# Patient Record
Sex: Female | Born: 1935 | Race: Black or African American | Hispanic: No | Marital: Single | Smoking: Never smoker
Health system: Southern US, Community
[De-identification: ages and names within clinical notes are randomized; demographics above are authoritative.]

## PROBLEM LIST (undated history)

## (undated) DIAGNOSIS — I872 Venous insufficiency (chronic) (peripheral): Secondary | ICD-10-CM

## (undated) DIAGNOSIS — I480 Paroxysmal atrial fibrillation: Secondary | ICD-10-CM

## (undated) DIAGNOSIS — I495 Sick sinus syndrome: Secondary | ICD-10-CM

## (undated) HISTORY — DX: Paroxysmal atrial fibrillation: I48.0

## (undated) HISTORY — DX: Venous insufficiency (chronic) (peripheral): I87.2

## (undated) HISTORY — DX: Sick sinus syndrome: I49.5

---

## 1998-09-08 ENCOUNTER — Other Ambulatory Visit: Admission: RE | Admit: 1998-09-08 | Discharge: 1998-09-08 | Payer: Self-pay | Admitting: Obstetrics and Gynecology

## 1998-09-18 ENCOUNTER — Ambulatory Visit (HOSPITAL_COMMUNITY): Admission: RE | Admit: 1998-09-18 | Discharge: 1998-09-18 | Payer: Self-pay | Admitting: Cardiology

## 1998-09-18 ENCOUNTER — Encounter: Payer: Self-pay | Admitting: Cardiology

## 1998-09-21 ENCOUNTER — Ambulatory Visit (HOSPITAL_COMMUNITY): Admission: RE | Admit: 1998-09-21 | Discharge: 1998-09-21 | Payer: Self-pay | Admitting: Cardiology

## 1998-09-21 HISTORY — PX: CARDIAC CATHETERIZATION: SHX172

## 1999-10-27 ENCOUNTER — Other Ambulatory Visit: Admission: RE | Admit: 1999-10-27 | Discharge: 1999-10-27 | Payer: Self-pay | Admitting: Obstetrics and Gynecology

## 2000-04-22 ENCOUNTER — Encounter (INDEPENDENT_AMBULATORY_CARE_PROVIDER_SITE_OTHER): Payer: Self-pay | Admitting: *Deleted

## 2000-04-22 ENCOUNTER — Encounter: Payer: Self-pay | Admitting: Emergency Medicine

## 2000-04-23 ENCOUNTER — Encounter (HOSPITAL_BASED_OUTPATIENT_CLINIC_OR_DEPARTMENT_OTHER): Payer: Self-pay | Admitting: General Surgery

## 2000-04-23 ENCOUNTER — Inpatient Hospital Stay (HOSPITAL_COMMUNITY): Admission: EM | Admit: 2000-04-23 | Discharge: 2000-04-25 | Payer: Self-pay | Admitting: Emergency Medicine

## 2000-04-24 ENCOUNTER — Encounter (HOSPITAL_BASED_OUTPATIENT_CLINIC_OR_DEPARTMENT_OTHER): Payer: Self-pay | Admitting: General Surgery

## 2000-05-22 ENCOUNTER — Ambulatory Visit (HOSPITAL_COMMUNITY): Admission: RE | Admit: 2000-05-22 | Discharge: 2000-05-22 | Payer: Self-pay | Admitting: General Surgery

## 2000-05-22 ENCOUNTER — Encounter (HOSPITAL_BASED_OUTPATIENT_CLINIC_OR_DEPARTMENT_OTHER): Payer: Self-pay | Admitting: General Surgery

## 2000-07-12 ENCOUNTER — Encounter (INDEPENDENT_AMBULATORY_CARE_PROVIDER_SITE_OTHER): Payer: Self-pay | Admitting: Specialist

## 2000-07-12 ENCOUNTER — Ambulatory Visit (HOSPITAL_COMMUNITY): Admission: RE | Admit: 2000-07-12 | Discharge: 2000-07-12 | Payer: Self-pay | Admitting: Gastroenterology

## 2001-09-04 ENCOUNTER — Ambulatory Visit: Admission: RE | Admit: 2001-09-04 | Discharge: 2001-09-04 | Payer: Self-pay | Admitting: Cardiology

## 2002-06-19 ENCOUNTER — Other Ambulatory Visit: Admission: RE | Admit: 2002-06-19 | Discharge: 2002-06-19 | Payer: Self-pay | Admitting: Obstetrics and Gynecology

## 2002-07-26 HISTORY — PX: CARDIOVASCULAR STRESS TEST: SHX262

## 2003-01-22 ENCOUNTER — Emergency Department (HOSPITAL_COMMUNITY): Admission: EM | Admit: 2003-01-22 | Discharge: 2003-01-22 | Payer: Self-pay | Admitting: Emergency Medicine

## 2003-01-30 ENCOUNTER — Emergency Department (HOSPITAL_COMMUNITY): Admission: EM | Admit: 2003-01-30 | Discharge: 2003-01-30 | Payer: Self-pay | Admitting: Emergency Medicine

## 2003-01-30 ENCOUNTER — Encounter: Payer: Self-pay | Admitting: Emergency Medicine

## 2003-02-17 ENCOUNTER — Encounter: Payer: Self-pay | Admitting: Cardiology

## 2003-02-17 ENCOUNTER — Ambulatory Visit (HOSPITAL_COMMUNITY): Admission: RE | Admit: 2003-02-17 | Discharge: 2003-02-17 | Payer: Self-pay | Admitting: Cardiology

## 2003-11-24 ENCOUNTER — Other Ambulatory Visit: Admission: RE | Admit: 2003-11-24 | Discharge: 2003-11-24 | Payer: Self-pay | Admitting: Obstetrics and Gynecology

## 2004-01-09 ENCOUNTER — Encounter: Admission: RE | Admit: 2004-01-09 | Discharge: 2004-01-09 | Payer: Self-pay | Admitting: Obstetrics and Gynecology

## 2004-01-14 ENCOUNTER — Encounter: Admission: RE | Admit: 2004-01-14 | Discharge: 2004-01-14 | Payer: Self-pay | Admitting: Obstetrics and Gynecology

## 2004-01-22 ENCOUNTER — Encounter: Admission: RE | Admit: 2004-01-22 | Discharge: 2004-01-22 | Payer: Self-pay | Admitting: Internal Medicine

## 2006-03-13 ENCOUNTER — Other Ambulatory Visit: Admission: RE | Admit: 2006-03-13 | Discharge: 2006-03-13 | Payer: Self-pay | Admitting: Obstetrics and Gynecology

## 2006-04-03 ENCOUNTER — Ambulatory Visit (HOSPITAL_COMMUNITY): Admission: RE | Admit: 2006-04-03 | Discharge: 2006-04-03 | Payer: Self-pay | Admitting: Obstetrics and Gynecology

## 2007-01-26 ENCOUNTER — Inpatient Hospital Stay (HOSPITAL_COMMUNITY): Admission: AD | Admit: 2007-01-26 | Discharge: 2007-02-01 | Payer: Self-pay | Admitting: Cardiology

## 2007-01-29 HISTORY — PX: CARDIAC CATHETERIZATION: SHX172

## 2007-01-30 HISTORY — PX: PACEMAKER INSERTION: SHX728

## 2007-03-18 ENCOUNTER — Emergency Department (HOSPITAL_COMMUNITY): Admission: EM | Admit: 2007-03-18 | Discharge: 2007-03-19 | Payer: Self-pay | Admitting: Emergency Medicine

## 2007-08-15 ENCOUNTER — Emergency Department (HOSPITAL_COMMUNITY): Admission: EM | Admit: 2007-08-15 | Discharge: 2007-08-15 | Payer: Self-pay | Admitting: Emergency Medicine

## 2008-06-05 ENCOUNTER — Encounter: Admission: RE | Admit: 2008-06-05 | Discharge: 2008-06-05 | Payer: Self-pay | Admitting: Obstetrics and Gynecology

## 2009-01-21 ENCOUNTER — Emergency Department (HOSPITAL_BASED_OUTPATIENT_CLINIC_OR_DEPARTMENT_OTHER): Admission: EM | Admit: 2009-01-21 | Discharge: 2009-01-21 | Payer: Self-pay | Admitting: Emergency Medicine

## 2009-01-21 ENCOUNTER — Ambulatory Visit: Payer: Self-pay | Admitting: Diagnostic Radiology

## 2010-01-20 ENCOUNTER — Encounter (INDEPENDENT_AMBULATORY_CARE_PROVIDER_SITE_OTHER): Payer: Self-pay | Admitting: Cardiology

## 2010-01-20 ENCOUNTER — Ambulatory Visit (HOSPITAL_COMMUNITY): Admission: RE | Admit: 2010-01-20 | Discharge: 2010-01-20 | Payer: Self-pay | Admitting: Cardiology

## 2011-03-15 LAB — COMPREHENSIVE METABOLIC PANEL
AST: 33 U/L (ref 0–37)
Albumin: 3.3 g/dL — ABNORMAL LOW (ref 3.5–5.2)
Alkaline Phosphatase: 103 U/L (ref 39–117)
BUN: 23 mg/dL (ref 6–23)
CO2: 28 mEq/L (ref 19–32)
Chloride: 109 mEq/L (ref 96–112)
Creatinine, Ser: 1.2 mg/dL (ref 0.4–1.2)
GFR calc non Af Amer: 44 mL/min — ABNORMAL LOW (ref >60)
Potassium: 4.4 mEq/L (ref 3.5–5.1)
Total Bilirubin: 0.5 mg/dL (ref 0.3–1.2)

## 2011-03-15 LAB — CBC
HCT: 38.4 % (ref 36.0–46.0)
MCV: 93.2 fL (ref 78.0–100.0)
RBC: 4.13 MIL/uL (ref 3.87–5.11)
WBC: 4.4 10*3/uL (ref 4.0–10.5)

## 2011-03-15 LAB — DIFFERENTIAL
Basophils Absolute: 0.1 10*3/uL (ref 0.0–0.1)
Basophils Relative: 2 % — ABNORMAL HIGH (ref 0–1)
Eosinophils Relative: 4 % (ref 0–5)
Lymphocytes Relative: 36 % (ref 12–46)
Monocytes Absolute: 0.5 10*3/uL (ref 0.1–1.0)
Neutro Abs: 2 10*3/uL (ref 1.7–7.7)

## 2011-03-15 LAB — URINE MICROSCOPIC-ADD ON

## 2011-03-15 LAB — URINALYSIS, ROUTINE W REFLEX MICROSCOPIC
Bilirubin Urine: NEGATIVE
Glucose, UA: NEGATIVE mg/dL
Ketones, ur: NEGATIVE mg/dL
Protein, ur: NEGATIVE mg/dL
pH: 6.5 (ref 5.0–8.0)

## 2011-03-15 LAB — PROTIME-INR: Prothrombin Time: 41.9 seconds — ABNORMAL HIGH (ref 11.6–15.2)

## 2011-04-15 NOTE — Procedures (Signed)
[place]. [facility]  Patient:    BRENNEN, GARDINER Visit Number: [id number] MRN: [id number]          Service Type: DSU Location: [facility] 2852 01 Attending Physician:  Pamella Pert Dictated by:   Delrae Rend, M.D. Admit Date:  09/04/2001   CC:         Madaline Savage, M.D.  Merlene Laughter. Renae Gloss, M.D.   Procedure Report  ATTENDING CARDIOLOGIST:  Madaline Savage, M.D.  PROCEDURE:  Electrical direct current cardioversion.  INDICATION:  Ms. Simon is a 77 year old female with history of chronic atrial fibrillation, who has been complaining of chest pain and shortness of breath, was put on Coumadin on an outpatient basis for four to six weeks and is brought back here for transesophageal echocardiogram and possible electrical cardioversion.  She underwent transesophageal echocardiogram just prior to the electrical cardioversion, which revealed no evidence of left atrial appendage clot.  Hence, electrical cardioversion was contemplated.  DESCRIPTION OF PROCEDURE:  Obtaining the help of anesthesia department, patient underwent direct current cardioversion under general anesthesia.  An initial shock with 200 joules was done.  Patient converted from atrial fibrillation to a normal sinus rhythm, however reverted back to atrial fibrillation.  Hence, a second electrical shock was delivered at 150 joules. The patient converted to sinus rhythm and maintained sinus rhythm.  The patient tolerated the procedure well, and she was transferred to the recovery unit in a stable condition. Dictated by:   Delrae Rend, M.D. Attending Physician:  Pamella Pert DD:  09/04/01 TD:  09/05/01 Job: [id number]

## 2011-04-15 NOTE — Op Note (Signed)
NAMESELINDA, KORZENIEWSKI              ACCOUNT NO.:  [id number]   MEDICAL RECORD NO.:  [id number]          PATIENT TYPE:  INP   LOCATION:  3708                         FACILITY:  [facility]   PHYSICIAN:  Darlin Priestly, MD  DATE OF BIRTH:  1933-06-11   DATE OF PROCEDURE:  01/30/2007  DATE OF DISCHARGE:                               OPERATIVE REPORT   PROCEDURES:  Insertion of a Medtronic EnRhythm [id number] generator, serial  [id number], with active atrial and ventricular leads.   ATTENDING SURGEON:  Darlin Priestly, MD.   COMPLICATIONS:  None.   INDICATIONS:  Miss Salahuddin is a 77 year old female patient of Dr. Lavonne Chick, with a history of sick sinus syndrome, up to 5.5-second pauses,  as well as paroxysmal atrial fibrillation.  She was admitted on  01/20/2007 and underwent cardiac catheterization by Dr. Elsie Lincoln on  01/29/2007, revealing a chronically occluded PDA with a normal EF.  She  is now brought for dual-chamber pacer insertion secondary to sick sinus  syndrome.   DESCRIPTION OF OPERATION:  After the patient gave informed written  consent, the patient was brought to the cardiac cath lab, where the left  chest was prepped and draped in sterile fashion.  ECG monitoring was  established.  Lidocaine 1% was then used to anesthetize the mid-left  subclavicular area.  Next, approximately a 3-cm horizontal incision was  then carried out, and hemostasis was obtained with electrocautery.  Next, approximately a 3 x 4-cm pocket was then created over the left  deltopectoral fascia, and hemostasis was obtained again with  electrocautery.  The left subclavian vein was then easily entered and a  guide wire was then easily passed into the right atrium.  A second stick  was then performed and a second guide wire was then introduced into the  right atrium.  Four silk sutures were then anchored to the pectoral  fascia at the base of the retained guide wires.  Over the first retained  guide wire,  a 7-French dilator and sheath were then easily passed, and  the dilator and guide wire were removed.  Through this, a 52-cm active  Medtronic lead, model #5076, serial [id number], was then easily passed  into the right atrium and the peel-away sheath was removed.  Over the  second retained guide wire, a second 7-0 Vicryl dilator and sheath were  then easily passed and the guide wire and dilator removed.  Through  this, an active 45-cm Medtronic lead, model [id number], serial [id number],  was then easily passed into the right atrium and the peel-away sheath  was removed.  A J curve was then placed on the ventricular stylet and  the ventricular lead was then prolapsed through the tricuspid valve and  positioned in the RV apex without difficulty.  We were able to capture  at 2 V in the apex, and the screw was then extended.  Thresholds were  determined.  R waves were measured at 10.7 mV.  Impedance was 1184 ohms.  Threshold in the ventricle was 0.9 V at 0.5 msec.  Current was 0.8 mA,  and 10 V  was negative for diaphragmatic stimulation.  The preformed J  stylet was then placed on the atrial lead and the atrial lead was then  positioned in the area of the right atrial appendage.  Again, we were  able to capture at 2 V and the screw was then extended and thresholds  were determined.  P waves were measured at 3.7 mV.  Impedance was 775  ohms.  Threshold in the atrium was 0.9 V at 0.5 msec.  Current was 1.6  mA, and 10 V was negative for diaphragmatic stimulation.  The leads were  then sutured into place with 2 silk sutures per lead.  The pocket was  then copiously irrigated with 1% kanamycin solution, and, again,  hemostasis was confirmed.  The leads were then connected in serial  fashion to a Medtronic EnRhythm [id number] generator, serial [id number].  Header screws were tightened and pacing was confirmed.  A single silk  suture was then placed in the apex of the pocket.  The generator and  leads  were then delivered into the pocket, and the header was secured to  the silk suture.  The subcutaneous layer was then closed using running 2-  0 Vicryl.  The skin was then closed using running 4-0 Vicryl.  Steri-  Strips were then applied.  The patient was transferred to the recovery  room in stable condition.   CONCLUSIONS:  Successful implant of a  Medtronic EnRhythm [id number]  generator, serial [id number], with active atrial and ventricular leads.      Darlin Priestly, MD  Electronically Signed     RHM/MEDQ  D:  01/30/2007  T:  01/30/2007  Job:  [id number]   cc:   Madaline Savage, M.D.

## 2011-04-15 NOTE — Procedures (Signed)
[place]. [facility]  Patient:    Jacqueline Mendoza, Jacqueline Mendoza                     MRN: [id number] Proc. Date: 07/12/00 Adm. Date:  [id number] Disc. Date: [id number] Attending:  Charna Elizabeth CC:         Andi Devon, M.D.   Procedure Report  PROCEDURE PERFORMED:  Esophagogastroduodenoscopy.  ENDOSCOPIST: Anselmo Rod, M.D.  INSTRUMENT USED:  Olympus video panendoscope.  INDICATIONS FOR PROCEDURE:  A 77 year old black female with a history of blood in stool and epigastric discomfort. Rule out peptic ulcer diseases, esophagitis, gastritis. etc.  PREPROCEDURE PREPARATION:  Informed consent was procured from the patient. The patient was fasted for 8 hours prior to procedure.  PREPROCEDURE PHYSICAL:  VITAL SIGNS: The patient has stable vital signs.  NECK:  Supple.  CHEST: Clear to auscultation.  CARDIAC: S1, S2 is regular.  ABDOMEN:  Abdomen soft with normal abdominal bowel sounds. No hepatosplenomegaly.  No masses palpable.  DESCRIPTION OF PROCEDURE:  The patient was placed in the left lateral decubitus position and sedated with 70 mg of Demerol and 7 mg of Versed intravenously.  Once the patient was adequately sedated and maintained on low flow oxygen and continuous cardiac monitoring, the Olympus video panendoscope was advanced through the mouthpiece, over the tongue into the esophagus under direct vision.  The entire esophagus appeared normal without evidence of ring, stricture, mass, or esophagitis.  The scope was then advanced in the stomach.  A small sessile polyp was seen in the high fundus.  On retroflexion, the was biopsied for pathology.  There was a small hiatal hernia seen on retroflexion as well as the rest of the EGD was normal.  No ulcers, erosions, masses or polyps were seen.  The duodenal bulb and the small bowel, distal bulb appeared normal.  There was no outlet obstruction.  IMPRESSION: 1. Small sessile polyp biopsied from high  fundus. 2. Small hiatal hernia. 3. Otherwise normal-appearing stomach and proximal small bowel to    60 cm.  RECOMMENDATION: 1. Await pathology results. 2. Await nonsteroidals. 3. Proceed with colonoscopy at this time. DD:  07/12/00 TD:  07/12/00 Job: [id number]

## 2011-04-15 NOTE — Procedures (Signed)
[place]. [facility]  Patient:    Jacqueline Mendoza, Jacqueline Mendoza Visit Number: [id number] MRN: [id number]          Service Type: DSU Location: [facility] 2852 01 Attending Physician:  Pamella Pert Dictated by:   Delrae Rend, M.D. Admit Date:  09/04/2001   CC:         [facility]  Merlene Laughter. Renae Gloss, M.D.   Procedure Report  ATTENDING CARDIOLOGIST:   Madaline Savage, M.D.  PROCEDURE:  Transesophageal echocardiogram.  INDICATION:  Patient with atrial fibrillation.  Transesophageal echocardiogram is performed to evaluate for presence of left atrial appendage clot and to evaluate for possible electrical cardioversion.  DESCRIPTION OF PROCEDURE:  After using mild intravenous sedation and local anesthetic spray, a [employer] omniplane transesophageal probe was easily introduced into the upper esophagus and transesophageal echocardiogram was performed.  Transgastric views were also obtained.  DATA:  Left atrium:  The left atrium is normal.  The left atrial appendage is well-visualized.  There is no left atrial appendage clot.  The velocity in the left atrial appendage is greater than 40 cm/sec.  Left ventricle:  The left ventricle shows normal segmental wall motion. Ejection fraction is estimated at around 55-60%.  Right atrium:  The right atrium is normal.  Right ventricle:  The right ventricle is normal.  Interatrial septum:  The interatrial septum is intact.  There is no evidence of atrial septal defect or patent foramen ovale by 2 D and color Doppler examination.  Mitral valve:  The mitral valve is normal.  There is mild thickening of the antral mitral leaflet.  There is mild central mitral valve regurgitation.  Tricuspid valve:  The tricuspid valve is normal.  There is mild tricuspid valvular regurgitation.  The pulmonary artery pressure is estimated at 30 mmHg.  Pulmonary valve.  The pulmonary valve is  normal.  Aortic valve:  The aortic valve is normal.  There is trivial aortic valvular regurgitation.  Aorta:  The root of the aorta, the arch of the aorta, and the thoracic aorta is normal.  There is no complex plaque visualized.  FINAL IMPRESSION: 1. Normal left ventricular systolic function. 2. No evidence of left atrial plaque or left atrial appendage plaque. Dictated by:   Delrae Rend, M.D. Attending Physician:  Pamella Pert DD:  09/04/01 TD:  09/05/01 Job: [id number]

## 2011-04-15 NOTE — Op Note (Signed)
[place]. [facility]  Patient:    Jacqueline Mendoza, Jacqueline Mendoza                     MRN: [id number] Proc. Date: 04/24/00 Adm. Date:  [id number] Disc. Date: [id number] Attending:  Sonda Primes CC:         Mardene Celeste. Lurene Shadow, M.D. (2)                           Operative Report  PREOPERATIVE DIAGNOSIS:  Acute cholecystitis.  POSTOPERATIVE DIAGNOSIS:  Acute cholecystitis.  PROCEDURE:  Laparoscopic cholecystectomy with intraoperative cholangiogram.  SURGEON:  Luisa Hart L. Lurene Shadow, M.D.  ASSISTANT:  Marnee Spring. Arkin, M.D.  SECOND ASSISTANT:  ______ PA-S.  ANESTHESIA:  General.  INDICATION:  Patient is a 77 year old woman presenting with severe upper abdominal pain with point and rebound tenderness in the epigastrium and right upper quadrant, associated with nausea and with vomiting.  She had mild leukocytosis of approximately 13,000 with a shift to the left, normal liver function studies and normal amylase and lipase.  She was admitted to the hospital, continued on antibiotics for the ensuing 24 hours with no relief of symptoms and she is brought to the operating room now for laparoscopic cholecystectomy.  DESCRIPTION OF PROCEDURE:  Following the induction of anesthesia, the patient positioned supinely, the abdomen was routinely prepped and draped to be included in the sterile operative field.  Open laparoscopy was created at the umbilicus with insufflation of the peritoneal cavity to 14 mmHg pressure, camera was inserted and visual exploration of the abdomen carried out.  In the right upper quadrant, the gallbladder was noted to be intrahepatic and acutely inflamed.  Liver edges were sharp; liver surface was smooth.  Anterior gastric wall appeared to be normal.  Duodenal sweep appeared to be normal.  No other small or large intestines viewed appeared to be abnormal.  Pelvic organs were not visualized.  Under direct vision, epigastric and lateral ports were placed  and the gallbladder was first emptied of its contents by aspiration and the aspirate sent for culture.  Gallbladder was then grasped and retracted cephalad with dissection carried down to the hepatoduodenal ligament with isolation of the cystic artery and cystic duct.  Cystic artery was doubly clipped and transected; cystic duct clipped proximally and opened.  Cystic duct cholangiogram was carried out with one-half-strength Hypaque injected in through the cystic duct with a Reddick catheter.  Resulting cholangiogram showed normal-caliber biliary radicles, no filling defects and normal flow of the contrast into the duodenum.  The catheter was removed and the cystic duct was doubly clipped and transected.  The gallbladder was then dissected free from the liver bed using electrocautery and maintaining hemostasis along the course of the dissection.  At the end of the dissection, the liver bed was treated extensively with electrocautery to secure hemostasis.  I then placed a Surgicel packing within the liver bed to further secure hemostasis.  The right upper quadrant was then thoroughly irrigated with normal saline and sucked dry.  The gallbladder was then retrieved through the umbilical port with an EndoCatch pouch and peritoneum then deflated, sponge, instrument and sharp count verified and wound was closed in layers as follows.  The umbilical wound was closed in two layers with 0 Dexon and 4-0 Dexon.  Epigastric and lateral flank wounds were closed with 4-0 Dexon and all wounds reinforced with Steri-Strips and sterile dressings applied, anesthetic reversed and  patient removed from the operating room to the recovery room in stable condition, having tolerated the procedure well. DD:  04/24/00 TD:  04/26/00 Job: [id number]

## 2011-04-15 NOTE — Cardiovascular Report (Signed)
Jacqueline Mendoza, Jacqueline Mendoza              ACCOUNT NO.:  [id number]   MEDICAL RECORD NO.:  [id number]          PATIENT TYPE:  INP   LOCATION:  3708                         FACILITY:  [facility]   PHYSICIAN:  Madaline Savage, M.D.DATE OF BIRTH:  04-Jul-1933   DATE OF PROCEDURE:  01/29/2007  DATE OF DISCHARGE:                            CARDIAC CATHETERIZATION   PROCEDURES PERFORMED:  Combined left heart catheterization with coronary  angiography, left ventricular angiography and left heart hemodynamics.   COMPLICATIONS:  None.   ENTRY SITE:  Right femoral.   DYE USED:  Omnipaque.   PATIENT PROFILE:  Ms. 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 Kellie Shropshire.  She has been  a cardiology of mine since the 1990s.  At that time, she had a cardiac  cath showing her only coronary lesion being a small posterior descending  coronary artery that was occluded midway down the vessel with normal  homocollaterals from that same vessel.   She currently enters the hospital after having had paroxysm of atrial  fibrillation.  She has been on flecainide in the past, and that has  prevented atrial fibrillation; however, she recently had pauses, and it  was felt that flecainide may be contributing to that.  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  It is  currently planned for the patient to have a permanent pacemaker by Dr.  Lenise Herald on January 30, 2007.  Subsequent to that, the patient will  be coumadinized, and flecainide can be restarted after the pacemaker has  been inserted.  Today, she enters the cath lab for recatheterization now  9 years after her original cardiac cath.   RESULTS:   PRESSURES:  Left ventricular pressure was 120/11, end-diastolic pressure  27.  Central aortic pressure 127/65, mean of 95.  No significant aortic  valve gradient by pullback  technique.   ANGIOGRAPHIC RESULTS:  The patient's left main, left circumflex and LAD  coronary arteries were all angiographically patent.  The proximal LAD  contained some ectasia but no significant lesions.  There were three  diagonals coming off the LAD.  The circumflex was a fairly large vessel  which terminated as a posterolateral branch.   The right coronary artery showed the only lesion to be a distal  posterior descending branch that was 100% occluded with homocollaterals  to a tiny distal portion of that same vessel.  This is unchanged from  the appearance of this lesion from 1999.  It is the only area of  narrowing located in the patient's coronary arterial tree.   LEFT VENTRICULAR ANGIOGRAM:  Vigorous contractility of all wall  segments.  I would estimate ejection fraction at 65%.  I see no evidence  of LV thrombus.  No evidence of mitral regurgitation, no evidence of  prolapse and no evidence of segmental wall motion abnormalities.   FINAL DIAGNOSIS:  The only coronary lesion seen is a 100% distal  posterior descending branch occlusion with reconstitution distally.  The  remaining portions of the coronary arterial tree are normal.  Normal LV  systolic function.   PLAN:  The patient is reassured with regard to her coronary anatomy and  will be a candidate in the near future for a permanent pacemaker for  tachy-brady syndrome.           ______________________________  Madaline Savage, M.D.     WHG/MEDQ  D:  01/29/2007  T:  01/29/2007  Job:  [id number]   cc:   Merlene Laughter. Renae Gloss, M.D.  Madaline Savage, M.D.  [facility] Cath Lab

## 2011-04-15 NOTE — Discharge Summary (Signed)
NAMEFINNLEE, GUARNIERI              ACCOUNT NO.:  [id number]   MEDICAL RECORD NO.:  [id number]          PATIENT TYPE:  INP   LOCATION:  3708                         FACILITY:  [facility]   PHYSICIAN:  Madaline Savage, M.D.DATE OF BIRTH:  1932/12/31   DATE OF ADMISSION:  01/26/2007  DATE OF DISCHARGE:  02/01/2007                               DISCHARGE SUMMARY   DISCHARGE DIAGNOSES:  1. Atrial fibrillation with 4-5 second pauses on event monitor      secondary to sick sinus syndrome.  2. Secondary to #1, received permanent transvenous pacemaker Medtronic      in rhythm.  Serial number [id number] placed in DDD mode.  3. Coronary disease with a 100% distal PDA disease with normal EF of      65%.  4. Hypertension.  5. Obesity.  6. Hyperlipidemia.   DISCHARGE CONDITION:  Improved.   PROCEDURES:  1. On January 29, 2007, combined left heart cath by Dr. Madaline Savage.  2. On January 30, 2007, placement of temporary transvenous pacemaker      Medtronic in rhythm as stated by Dr. Lenise Herald.   DISCHARGE MEDICATIONS:  1. KCl 20 mg twice a day.  2. Flecainide 50 mg twice a day.  3. Toprol XL 50 mg daily.  4. Aspirin 81 mg daily.  5. Coumadin 5 mg one daily beginning on February 02, 2007.  6. AcipHex 20 mg daily.  7. Toprol HCT 20/12.5 twice a day.  8. Vytorin 10/20, hold Vytorin until see Dr. Elsie Lincoln.  9. Hold Co-Q 10 for 3 days.  10.Mobic 7.5 mg once daily.  11.Detrol 4 mg daily.  12.Colchicine 0.6 mg daily.  13.Multivitamin daily.   DISCHARGE INSTRUCTIONS:  1. See pacemaker sheet for full discharge instructions.  2. Increase activity slowly.  3. Low-sodium heart healthy diet.  4. Have blood work done on Monday.  5. Wash right groin cath site with soap and water.  Call if any      bleeding, swelling, drainage.  6. See Dr. Mikey Bussing PA, [facility], on February 09, 2007, at 11 a.m.      for pacer site check.  7. See Dr. Elsie Lincoln as before.   HISTORY OF PRESENT ILLNESS:  A  77 year old female patient was seen for  recently discovered sinus pauses up to 5-1/2 seconds.  Also, transient  rapid atrial fibrillation that was resolved on admission.  Because of  the significance of her pauses, she was admitted for observation.  Her  medication was decreased and it was felt she needed to proceed with  permanent pacemaker to prevent rapid atrial fibrillation.   She was brought into [facility] for plans for the permanent pacemaker, though.  It was felt important to evaluate her coronary status prior to her  pacemaker.   PAST MEDICAL HISTORY:  1. Recent rapid atrial fibrillation.  2. History of thyroid problems on amiodarone.  3. Echo in 2007 revealed LVH moderately, and left atrium was      moderately to severely dilated.  She had coronary disease at that      time,  50% lesion of her left circumflex, 100% occlusion of very      distal PDA.   FAMILY/SOCIAL HISTORY:  See H&P.   OUTPATIENT MEDICATIONS:  1. Flecainide 50 b.i.d.  2. Toprol 50.  3. Benazepril/HCT 20/12.5 b.i.d.  4. Vytorin 10/20.  5. AcipHex 20.  6. Mobic 7.5.  7. Potassium 22 daily.  8. Aspirin 81.  9. Co-Q 10 10 every evening.  10.Multivitamin.  11.Detrol LA   PHYSICAL EXAMINATION:  VITAL SIGNS:  Blood pressure 120/78, pulse 80,  temperature 97.9.  CHEST:  Clear.  HEART:  Irregularly irregular.  Pacer site without hematoma.  Telemetry:  She was in atrial fibrillation.   LABORATORY DATA:  Hemoglobin 13.6, hematocrit 39.5, WBC 5.1, platelets  219.  These remained stable.  Pro time 17.1, INR of 1.4, PTT 28.  On  admission she was on heparin, and at time of discharge, these were not  done as she had not yet started Coumadin back.   Chemistry:  Sodium 140, potassium 4, chloride 102, CO2 30, glucose 94,  BUN 27, creatinine 1.45, glucose was slightly elevated during  hospitalization, but did come down.  .   Total protein 6.2, albumin 3.3, AST 37, ALT 30, ALP 71, total bili 0.9,  magnesium 2.2.    Cholesterol 122, triglycerides 189, HDL 32, LDL 52.   TSH 2.468.   Chest x-ray on February 29:  Findings compatible with mild cardiomegaly,  mild pulmonary venous hypertension.  Lungs were clear.  Follow-up after  the procedure per pacemaker placement without pneumothorax or other  apparent complications.   EKGs:  Initial EKG sinus rhythm, heart rate 62, first-degree AV block.  A follow-up was later on the 29, sinus rhythm, first-degree AV block  March 4 sinus rhythm, normal EKG.  March 5 sinus rhythm.   HOSPITAL COURSE:  Ms. Lundy was admitted secondary to long pauses in  sinus rhythm.  She was brought in, underwent cardiac catheterization  with results as previously stated, then went on to receive a permanent  transvenous pacemaker by Dr. Jenne Campus, a Medtronic device.  She did well.  Anticoagulants were held, and she was to restart the Coumadin on February 02, 2007.  No Lovenox or heparin crossover.  When she was discharged, she  had no hematoma at this site and was stable and ready for discharge.      Darcella Gasman. Annie Paras, N.P.    ______________________________  Madaline Savage, M.D.    LRI/MEDQ  D:  04/15/2007  T:  04/16/2007  Job:  [id number]   cc:   Madaline Savage, M.D.  Darlin Priestly, MD  Merlene Laughter. Renae Gloss, M.D.

## 2011-04-15 NOTE — Procedures (Signed)
[place]. [facility]  Patient:    Jacqueline Mendoza, Jacqueline Mendoza                     MRN: [id number] Proc. Date: 07/12/00 Adm. Date:  [id number] Disc. Date: [id number] Attending:  Charna Elizabeth CC:         Merlene Laughter. Renae Gloss, M.D.                           Procedure Report  DATE OF BIRTH: 05-27-1933  REFERRING PHYSICIAN: Merlene Laughter. Renae Gloss, M.D.  PROCEDURE PERFORMED:  Colonoscopy.  ENDOSCOPIST: Anselmo Rod, M.D.  INSTRUMENT USED:  Olympus video colonoscope.  INDICATIONS FOR PROCEDURE:  A 77 year old black female with coag-positive stool.  Rule out colonic polyps, masses, hemorrhoids.  PREPROCEDURE PREPARATION: Informed consent was procured from the patient.  The patient fasted for eight hours prior to the procedure and prepped with a bottle of magnesium citrate and a gallon of NuLytely the night prior to the procedure.  PREPROCEDURE PHYSICAL:  VITAL SIGNS:  The patient with stable vital signs.  NECK:  Supple.  CHEST:  Clear to auscultation.  CARDIAC: S1 S2 regular.  ABDOMEN:  Abdomen soft with normal abdominal bowel sounds.  DESCRIPTION OF PROCEDURE: The patient was placed in the left lateral decubitus position.  No additional sedation was used for colonoscopy.  Once the patient was adequately positioned and maintained on low flow oxygen and continued cardiac monitoring, the Olympus video colonoscope was advanced through the rectum to the cecum without difficulty except for a small amount of residual stool and left-sided diverticular disease. No other abnormalities were seen. The patient had small internal hemorrhoids seen on retroflexion in the rectum. The patient tolerated the procedure well without complications.  The appendiceal orifice and the ileocecal valve appeared healthy.  IMPRESSION: 1. Significant left-sided diverticulosis. 2. Small nonbleeding internal hemorrhoids. 3. Some residual stool in the colon, procedure completed  upto  cecum.    Normal-appearing cecum, right colon and transverse colon.  RECOMMENDATION: 1. The patient advised to increase her fluids and fiber in her diet. 2. Outpatient follow up is advised in the next two weeks. 3. Information on diverticular disease has been handed to her for    her education. DD:  07/12/00 TD:  07/12/00 Job: [id number]

## 2011-05-17 ENCOUNTER — Emergency Department (HOSPITAL_BASED_OUTPATIENT_CLINIC_OR_DEPARTMENT_OTHER)
Admission: EM | Admit: 2011-05-17 | Discharge: 2011-05-17 | Disposition: A | Discharge status: Other Acute Inpatient Hospital | Payer: Medicare Other | Source: Home / Self Care | Attending: Emergency Medicine | Admitting: Emergency Medicine

## 2011-05-17 ENCOUNTER — Inpatient Hospital Stay (HOSPITAL_COMMUNITY)
Admission: AD | Admit: 2011-05-17 | Discharge: 2011-05-25 | DRG: 690 | Disposition: A | Discharge status: Home Health | Payer: Medicare Other | Source: Other Acute Inpatient Hospital | Attending: Internal Medicine | Admitting: Internal Medicine

## 2011-05-17 ENCOUNTER — Emergency Department (INDEPENDENT_AMBULATORY_CARE_PROVIDER_SITE_OTHER): Payer: Medicare Other

## 2011-05-17 DIAGNOSIS — E785 Hyperlipidemia, unspecified: Secondary | ICD-10-CM | POA: Diagnosis present

## 2011-05-17 DIAGNOSIS — I959 Hypotension, unspecified: Secondary | ICD-10-CM | POA: Diagnosis present

## 2011-05-17 DIAGNOSIS — I509 Heart failure, unspecified: Secondary | ICD-10-CM | POA: Diagnosis not present

## 2011-05-17 DIAGNOSIS — N390 Urinary tract infection, site not specified: Principal | ICD-10-CM | POA: Diagnosis present

## 2011-05-17 DIAGNOSIS — Z950 Presence of cardiac pacemaker: Secondary | ICD-10-CM

## 2011-05-17 DIAGNOSIS — E860 Dehydration: Secondary | ICD-10-CM | POA: Diagnosis present

## 2011-05-17 DIAGNOSIS — I2510 Atherosclerotic heart disease of native coronary artery without angina pectoris: Secondary | ICD-10-CM | POA: Diagnosis present

## 2011-05-17 DIAGNOSIS — I517 Cardiomegaly: Secondary | ICD-10-CM

## 2011-05-17 DIAGNOSIS — R197 Diarrhea, unspecified: Secondary | ICD-10-CM | POA: Diagnosis not present

## 2011-05-17 DIAGNOSIS — R5381 Other malaise: Secondary | ICD-10-CM | POA: Diagnosis present

## 2011-05-17 DIAGNOSIS — Z9181 History of falling: Secondary | ICD-10-CM

## 2011-05-17 DIAGNOSIS — E669 Obesity, unspecified: Secondary | ICD-10-CM | POA: Diagnosis present

## 2011-05-17 DIAGNOSIS — I4891 Unspecified atrial fibrillation: Secondary | ICD-10-CM

## 2011-05-17 DIAGNOSIS — R7402 Elevation of levels of lactic acid dehydrogenase (LDH): Secondary | ICD-10-CM | POA: Diagnosis present

## 2011-05-17 DIAGNOSIS — A498 Other bacterial infections of unspecified site: Secondary | ICD-10-CM | POA: Diagnosis present

## 2011-05-17 DIAGNOSIS — K219 Gastro-esophageal reflux disease without esophagitis: Secondary | ICD-10-CM | POA: Diagnosis present

## 2011-05-17 DIAGNOSIS — I10 Essential (primary) hypertension: Secondary | ICD-10-CM | POA: Diagnosis present

## 2011-05-17 DIAGNOSIS — D638 Anemia in other chronic diseases classified elsewhere: Secondary | ICD-10-CM | POA: Diagnosis present

## 2011-05-17 DIAGNOSIS — M6282 Rhabdomyolysis: Secondary | ICD-10-CM | POA: Diagnosis present

## 2011-05-17 DIAGNOSIS — R112 Nausea with vomiting, unspecified: Secondary | ICD-10-CM | POA: Diagnosis present

## 2011-05-17 DIAGNOSIS — Z7901 Long term (current) use of anticoagulants: Secondary | ICD-10-CM

## 2011-05-17 DIAGNOSIS — E8779 Other fluid overload: Secondary | ICD-10-CM | POA: Diagnosis not present

## 2011-05-17 DIAGNOSIS — R5383 Other fatigue: Secondary | ICD-10-CM | POA: Diagnosis present

## 2011-05-17 DIAGNOSIS — D696 Thrombocytopenia, unspecified: Secondary | ICD-10-CM | POA: Diagnosis present

## 2011-05-17 DIAGNOSIS — R7401 Elevation of levels of liver transaminase levels: Secondary | ICD-10-CM | POA: Diagnosis present

## 2011-05-17 DIAGNOSIS — R748 Abnormal levels of other serum enzymes: Secondary | ICD-10-CM | POA: Diagnosis present

## 2011-05-17 LAB — CARDIAC PANEL(CRET KIN+CKTOT+MB+TROPI)
CK, MB: 7.2 ng/mL (ref 0.3–4.0)
CK, MB: 6.1 ng/mL (ref 0.3–4.0)
Total CK: 780 U/L — ABNORMAL HIGH (ref 7–177)
Troponin I: <0.3 ng/mL (ref <0.30)

## 2011-05-17 LAB — BASIC METABOLIC PANEL
Calcium: 10.4 mg/dL (ref 8.4–10.5)
Chloride: 103 mEq/L (ref 96–112)
Creatinine, Ser: 1.1 mg/dL (ref 0.50–1.10)
GFR calc Af Amer: 58 mL/min — ABNORMAL LOW (ref >60)
GFR calc non Af Amer: 48 mL/min — ABNORMAL LOW (ref >60)

## 2011-05-17 LAB — DIFFERENTIAL
Basophils Absolute: 0 10*3/uL (ref 0.0–0.1)
Lymphocytes Relative: 27 % (ref 12–46)
Monocytes Absolute: 1 10*3/uL (ref 0.1–1.0)
Monocytes Relative: 16 % — ABNORMAL HIGH (ref 3–12)
Neutro Abs: 3.5 10*3/uL (ref 1.7–7.7)

## 2011-05-17 LAB — CBC
HCT: 34.2 % — ABNORMAL LOW (ref 36.0–46.0)
Hemoglobin: 12.3 g/dL (ref 12.0–15.0)
MCHC: 36 g/dL (ref 30.0–36.0)

## 2011-05-17 LAB — PROTIME-INR: INR: 2.99 — ABNORMAL HIGH (ref 0.00–1.49)

## 2011-05-17 LAB — CK TOTAL AND CKMB (NOT AT ARMC): Relative Index: 0.9 (ref 0.0–2.5)

## 2011-05-17 LAB — URINALYSIS, ROUTINE W REFLEX MICROSCOPIC
Ketones, ur: NEGATIVE mg/dL
Nitrite: POSITIVE — AB
Protein, ur: NEGATIVE mg/dL
Urobilinogen, UA: 0.2 mg/dL (ref 0.0–1.0)
pH: 5.5 (ref 5.0–8.0)

## 2011-05-17 LAB — TROPONIN I: Troponin I: <0.3 ng/mL (ref <0.30)

## 2011-05-17 NOTE — H&P (Signed)
Jacqueline Mendoza, DOTO NO.:  [id number]  MEDICAL RECORD NO.:  [id number]  LOCATION:  1425                         FACILITY:  [facility]  PHYSICIAN:  Andreas Blower, MD       DATE OF BIRTH:  [birth year]  DATE OF ADMISSION:  05/17/2011 DATE OF DISCHARGE:                             HISTORY & PHYSICAL   PRIMARY CARE PHYSICIAN:  Alva Garnet, MD  CARDIOLOGIST:  Governor Rooks, MD  GASTROENTEROLOGIST:  Anselmo Rod, MD, [facility]  CHIEF COMPLAINT:  Weakness and fall.  HISTORY OF PRESENT ILLNESS:  Jacqueline Mendoza is a 77 year old African American female with history of atrial fibrillation, coronary artery disease, hypertension, GERD, obesity, and hyperlipidemia, who presents with the above complaints.  The patient reports that about 3 weeks ago, Dr. Alanda Amass, her cardiologist had adjusted her cardiac medications and took her off amiodarone and placed her on dronedarone.  Since then, she has noted intermittent episodes of nausea and has had 2 episodes of vomiting.  She most recently vomited 2 days ago and had vomited whatever she was eating.  This morning, she stood to go to the bathroom and felt really weak and had her knees buckled.  She fell to the floor gently, did not hit her head, did not lose consciousness.  As a result, EMS was called.  In the ER, the patient was thought to be dehydrated and was given gentle fluids and was also found to have a UTI as a result the hospitalist service was consulted to help manage her further.  The patient denies any fever.  She does complain about intermittent chills and does have intermittent nausea and 2 episodes of vomiting in the last 3 weeks.  She did complain about chest pain that she had, that lasted for about 1-1/2 seconds, felt sharp and quickly resolved, more than a week ago.  Denies any jaw pain.  Denies any back pain.  Denies any abdominal pain or diarrhea.  Denies any headaches or vision changes.  REVIEW OF  SYSTEMS:  All systems were reviewed with the patient and was positive as per HPI, otherwise all other systems are negative.  PAST MEDICAL HISTORY: 1. History of atrial fibrillation, status post pacemaker, currently     anticoagulated on Coumadin. 2. Chronic anticoagulation for atrial fibrillation. 3. History of coronary artery disease. 4. Hypertension. 5. GERD. 6. Obesity. 7. Hyperlipidemia.  SOCIAL HISTORY:  The patient does not smoke, drinks a glass of wine every 2 weeks intermittently.  Lives with her daughter, does not use a cane or walker at home to walk.  FAMILY HISTORY:  Father had stroke.  Mother had hypertension and stroke.  HOME MEDICATIONS: 1. Multivitamin 1 tablet p.o. daily. 2. Fish oil 1 capsule p.o. daily. 3. Calcium over-the-counter 1 tablet p.o. daily. 4. Vitamin D over-the-counter 1 tablet p.o. daily. 5. Colace over-the-counter 1 tablet p.o. daily. 6. Simethicone over-the-counter 1 tablet p.o. daily. 7. Aspirin 81 mg p.o. daily. 8. Dexlansoprazole 60 mg p.o. daily. 9. Potassium chloride 40 mEq p.o. daily. 10.Simvastatin 20 mg p.o. daily. 11.Colcrys 0.6 mg p.o. twice daily. 12.Enablex 15 mg p.o. daily. 13.Dronedarone 400 mg p.o. twice daily. 14.Diltiazem extended release 180 mg  p.o. daily. 15.Hydrochlorothiazide 25 mg p.o. daily. 16.Warfarin 5 mg p.o. daily. 17.Metoprolol 50 mg p.o. twice daily.  PHYSICAL EXAMINATION:  VITAL SIGNS:  Temperature 98.4, pulse 82, respirations 16, blood pressure 103/68, and saturating at 98% on room air. GENERAL:  The patient is alert and oriented x3, did not appear to be in acute distress, was lying in bed comfortably. HEENT:  Extraocular motions are intact.  Pupils equal and round.  Had moist mucous membranes. NECK:  Supple. HEART:  Irregular with S1 and S2. LUNGS:  Clear to auscultation bilaterally. ABDOMEN:  Soft, nontender, nondistended.  Positive bowel sounds. NEUROLOGIC:  Cranial nerves II through XII grossly  intact.  Had 5/5 motor strength in upper extremities, was slightly weak 4+ in lower extremities.  RADIOLOGY/IMAGING:  She had a chest x-ray two-view, which showed enlargement of cardiac silhouette with slight pulmonary vascular congestion and prior pacemaker placement.  Bronchiectasis changes noted.  LABORATORY DATA:  CBC showed a white count of 6.3, hemoglobin 12.3, hematocrit 34.2, and platelet count 145.  INR 2.99.  The electrolytes are normal with a BUN of 28 , creatinine 1.10.  CK is 920 and initial troponin less than 0.30.  UA was positive for nitrites, trace leukocytes, many bacteria.  ASSESSMENT AND PLAN: 1. Urinary tract infection.  We will have the patient on ceftriaxone.     Change antibiotics based on urine culture and sensitivity. 2. Generalized weakness, likely due to urinary tract infection.  We     will have PT and OT to evaluate her.  The patient appears to be     deconditioned. 3. Nausea, vomiting, uncertain if dronedarone is causing the patient     to have nausea and vomiting.  We will have the patient on PPI.     Cardiology has been consulted to help manage her cardiac     medications. 4. Fall, likely due to weakness and deconditioning.  We will have PT     and OT to evaluate the patient. 5. Mild rhabdomyolysis with elevated CPK likely due to fall.  We will     gently hydrate her. 6. Mild dehydration.  The patient's BUN was elevated at 28.  We will     give the patient gentle hydration. 7. Thrombocytopenia.  Platelets are 145, slightly on the lower side,     will monitor for now. 8. History of atrial fibrillation, currently rate controlled.     Continue Coumadin.  Status post pacemaker placement. 9. History of coronary artery disease.  Continue home medications.  We     will trend her troponins, given her weakness. 10.Hypertension, stable at this time.  Holding hydrochlorothiazide as     the patient is dehydrated, otherwise resume home medications      withhold parameters. 11.Gastroesophageal reflux disease.  We will have the patient on PPI. 12.Obesity.  Outpatient management with diet and exercise. 13.Hyperlipidemia, stable at this time.  Continue statin. 14.Prophylaxis therapeutic INR. 15.Code status.  The patient is full code.  She indicated that she     will think about her code status and will get back to [place] tomorrow.     For the time being, we will have her on full code until the patient     makes a decision.  Time spent on admission, talking to the patient, and coordinating care, was 1 hour.   Andreas Blower, MD   SR/MEDQ  D:  05/17/2011  T:  05/17/2011  Job:  [id number]  Electronically Signed by Wardell Heath Susie Pousson  on 05/17/2011 10:45:14 PM

## 2011-05-18 ENCOUNTER — Inpatient Hospital Stay (HOSPITAL_COMMUNITY): Payer: Medicare Other

## 2011-05-18 LAB — COMPREHENSIVE METABOLIC PANEL
Alkaline Phosphatase: 85 U/L (ref 39–117)
BUN: 24 mg/dL — ABNORMAL HIGH (ref 6–23)
CO2: 28 mEq/L (ref 19–32)
Calcium: 9.6 mg/dL (ref 8.4–10.5)
GFR calc Af Amer: 60 mL/min — ABNORMAL LOW (ref >60)
GFR calc non Af Amer: 49 mL/min — ABNORMAL LOW (ref >60)
Glucose, Bld: 89 mg/dL (ref 70–99)
Potassium: 3.8 mEq/L (ref 3.5–5.1)
Total Protein: 5.1 g/dL — ABNORMAL LOW (ref 6.0–8.3)

## 2011-05-18 LAB — CBC
HCT: 30.4 % — ABNORMAL LOW (ref 36.0–46.0)
MCH: 30.6 pg (ref 26.0–34.0)
MCV: 89.4 fL (ref 78.0–100.0)
Platelets: 125 10*3/uL — ABNORMAL LOW (ref 150–400)
RDW: 15.3 % (ref 11.5–15.5)

## 2011-05-18 LAB — IRON AND TIBC
Iron: 52 ug/dL (ref 42–135)
Saturation Ratios: 30 % (ref 20–55)
TIBC: 173 ug/dL — ABNORMAL LOW (ref 250–470)

## 2011-05-18 LAB — FOLATE: Folate: 13.7 ng/mL

## 2011-05-18 LAB — VITAMIN B12: Vitamin B-12: 711 pg/mL (ref 211–911)

## 2011-05-18 LAB — CARDIAC PANEL(CRET KIN+CKTOT+MB+TROPI)
Relative Index: 0.9 (ref 0.0–2.5)
Total CK: 583 U/L — ABNORMAL HIGH (ref 7–177)

## 2011-05-19 ENCOUNTER — Inpatient Hospital Stay (HOSPITAL_COMMUNITY): Payer: Medicare Other

## 2011-05-19 LAB — CBC
MCH: 31 pg (ref 26.0–34.0)
MCV: 90.8 fL (ref 78.0–100.0)
Platelets: 136 10*3/uL — ABNORMAL LOW (ref 150–400)
RDW: 15.6 % — ABNORMAL HIGH (ref 11.5–15.5)
WBC: 5.6 10*3/uL (ref 4.0–10.5)

## 2011-05-19 LAB — COMPREHENSIVE METABOLIC PANEL
BUN: 22 mg/dL (ref 6–23)
Calcium: 8.7 mg/dL (ref 8.4–10.5)
GFR calc Af Amer: >60 mL/min (ref >60)
Glucose, Bld: 95 mg/dL (ref 70–99)
Total Protein: 4.8 g/dL — ABNORMAL LOW (ref 6.0–8.3)

## 2011-05-19 LAB — DIFFERENTIAL
Eosinophils Absolute: 0.1 10*3/uL (ref 0.0–0.7)
Eosinophils Relative: 2 % (ref 0–5)
Lymphs Abs: 2 10*3/uL (ref 0.7–4.0)
Monocytes Relative: 15 % — ABNORMAL HIGH (ref 3–12)

## 2011-05-19 LAB — URINE CULTURE: Colony Count: >=100000

## 2011-05-20 LAB — DIFFERENTIAL
Lymphocytes Relative: 27 % (ref 12–46)
Lymphs Abs: 1.6 10*3/uL (ref 0.7–4.0)
Neutrophils Relative %: 57 % (ref 43–77)

## 2011-05-20 LAB — BASIC METABOLIC PANEL
CO2: 25 mEq/L (ref 19–32)
Chloride: 111 mEq/L (ref 96–112)
Creatinine, Ser: 0.91 mg/dL (ref 0.50–1.10)

## 2011-05-20 LAB — CBC
HCT: 29.8 % — ABNORMAL LOW (ref 36.0–46.0)
MCV: 91.7 fL (ref 78.0–100.0)
Platelets: 159 10*3/uL (ref 150–400)
RBC: 3.25 MIL/uL — ABNORMAL LOW (ref 3.87–5.11)
WBC: 6.1 10*3/uL (ref 4.0–10.5)

## 2011-05-20 LAB — OVA AND PARASITE EXAMINATION: Ova and parasites: NONE SEEN

## 2011-05-20 LAB — PROTIME-INR: Prothrombin Time: 23.9 seconds — ABNORMAL HIGH (ref 11.6–15.2)

## 2011-05-20 LAB — HEPATIC FUNCTION PANEL
AST: 123 U/L — ABNORMAL HIGH (ref 0–37)
Albumin: 2.6 g/dL — ABNORMAL LOW (ref 3.5–5.2)
Total Bilirubin: 0.7 mg/dL (ref 0.3–1.2)
Total Protein: 5.4 g/dL — ABNORMAL LOW (ref 6.0–8.3)

## 2011-05-21 ENCOUNTER — Inpatient Hospital Stay (HOSPITAL_COMMUNITY): Payer: Medicare Other

## 2011-05-21 DIAGNOSIS — Z7901 Long term (current) use of anticoagulants: Secondary | ICD-10-CM

## 2011-05-21 DIAGNOSIS — D649 Anemia, unspecified: Secondary | ICD-10-CM

## 2011-05-21 DIAGNOSIS — R112 Nausea with vomiting, unspecified: Secondary | ICD-10-CM

## 2011-05-21 DIAGNOSIS — R109 Unspecified abdominal pain: Secondary | ICD-10-CM

## 2011-05-21 LAB — CBC
HCT: 27.8 % — ABNORMAL LOW (ref 36.0–46.0)
Platelets: 165 10*3/uL (ref 150–400)
RDW: 16.6 % — ABNORMAL HIGH (ref 11.5–15.5)
WBC: 7.7 10*3/uL (ref 4.0–10.5)

## 2011-05-21 LAB — COMPREHENSIVE METABOLIC PANEL
ALT: 129 U/L — ABNORMAL HIGH (ref 0–35)
AST: 121 U/L — ABNORMAL HIGH (ref 0–37)
Albumin: 2.5 g/dL — ABNORMAL LOW (ref 3.5–5.2)
Alkaline Phosphatase: 92 U/L (ref 39–117)
Chloride: 108 mEq/L (ref 96–112)
Potassium: 4 mEq/L (ref 3.5–5.1)
Sodium: 136 mEq/L (ref 135–145)
Total Bilirubin: 0.9 mg/dL (ref 0.3–1.2)
Total Protein: 5.2 g/dL — ABNORMAL LOW (ref 6.0–8.3)

## 2011-05-21 LAB — EHEC TOXIN BY EIA, STOOL: EHEC Toxin by EIA: NEGATIVE

## 2011-05-22 DIAGNOSIS — D649 Anemia, unspecified: Secondary | ICD-10-CM

## 2011-05-22 DIAGNOSIS — R112 Nausea with vomiting, unspecified: Secondary | ICD-10-CM

## 2011-05-22 DIAGNOSIS — R109 Unspecified abdominal pain: Secondary | ICD-10-CM

## 2011-05-22 DIAGNOSIS — Z7901 Long term (current) use of anticoagulants: Secondary | ICD-10-CM

## 2011-05-22 LAB — COMPREHENSIVE METABOLIC PANEL
ALT: 129 U/L — ABNORMAL HIGH (ref 0–35)
AST: 112 U/L — ABNORMAL HIGH (ref 0–37)
Alkaline Phosphatase: 105 U/L (ref 39–117)
CO2: 23 mEq/L (ref 19–32)
Calcium: 9.6 mg/dL (ref 8.4–10.5)
GFR calc Af Amer: >60 mL/min (ref >60)
GFR calc non Af Amer: >60 mL/min (ref >60)
Glucose, Bld: 85 mg/dL (ref 70–99)
Potassium: 3.9 mEq/L (ref 3.5–5.1)
Sodium: 136 mEq/L (ref 135–145)
Total Protein: 5.5 g/dL — ABNORMAL LOW (ref 6.0–8.3)

## 2011-05-22 LAB — CBC
Hemoglobin: 10.2 g/dL — ABNORMAL LOW (ref 12.0–15.0)
Platelets: 142 10*3/uL — ABNORMAL LOW (ref 150–400)
RBC: 3.24 MIL/uL — ABNORMAL LOW (ref 3.87–5.11)

## 2011-05-22 LAB — HEPATITIS PANEL, ACUTE
HCV Ab: NEGATIVE
Hep B C IgM: NEGATIVE
Hepatitis B Surface Ag: NEGATIVE

## 2011-05-22 LAB — TSH: TSH: 3.712 u[IU]/mL (ref 0.350–4.500)

## 2011-05-23 LAB — COMPREHENSIVE METABOLIC PANEL
ALT: 105 U/L — ABNORMAL HIGH (ref 0–35)
AST: 82 U/L — ABNORMAL HIGH (ref 0–37)
Albumin: 2.7 g/dL — ABNORMAL LOW (ref 3.5–5.2)
CO2: 25 mEq/L (ref 19–32)
Calcium: 9.9 mg/dL (ref 8.4–10.5)
Creatinine, Ser: 1.05 mg/dL (ref 0.50–1.10)
GFR calc non Af Amer: 51 mL/min — ABNORMAL LOW (ref >60)
Sodium: 138 mEq/L (ref 135–145)
Total Protein: 5.4 g/dL — ABNORMAL LOW (ref 6.0–8.3)

## 2011-05-23 LAB — CBC
MCH: 31.3 pg (ref 26.0–34.0)
MCHC: 34 g/dL (ref 30.0–36.0)
MCV: 92.2 fL (ref 78.0–100.0)
Platelets: 170 10*3/uL (ref 150–400)
RDW: 17 % — ABNORMAL HIGH (ref 11.5–15.5)

## 2011-05-23 LAB — DIFFERENTIAL
Basophils Absolute: 0.1 10*3/uL (ref 0.0–0.1)
Eosinophils Absolute: 0.2 10*3/uL (ref 0.0–0.7)
Eosinophils Relative: 3 % (ref 0–5)
Lymphs Abs: 1.7 10*3/uL (ref 0.7–4.0)
Monocytes Absolute: 0.8 10*3/uL (ref 0.1–1.0)
Neutrophils Relative %: 57 % (ref 43–77)

## 2011-05-24 ENCOUNTER — Inpatient Hospital Stay (HOSPITAL_COMMUNITY): Payer: Medicare Other

## 2011-05-24 LAB — CBC
HCT: 29.7 % — ABNORMAL LOW (ref 36.0–46.0)
MCH: 31.4 pg (ref 26.0–34.0)
MCV: 92.2 fL (ref 78.0–100.0)
Platelets: 215 10*3/uL (ref 150–400)
RBC: 3.22 MIL/uL — ABNORMAL LOW (ref 3.87–5.11)

## 2011-05-24 LAB — DIFFERENTIAL
Eosinophils Absolute: 0.2 10*3/uL (ref 0.0–0.7)
Lymphocytes Relative: 24 % (ref 12–46)
Lymphs Abs: 1.3 10*3/uL (ref 0.7–4.0)
Monocytes Relative: 17 % — ABNORMAL HIGH (ref 3–12)
Neutrophils Relative %: 55 % (ref 43–77)

## 2011-05-24 LAB — COMPREHENSIVE METABOLIC PANEL
ALT: 97 U/L — ABNORMAL HIGH (ref 0–35)
Alkaline Phosphatase: 96 U/L (ref 39–117)
BUN: 24 mg/dL — ABNORMAL HIGH (ref 6–23)
CO2: 30 mEq/L (ref 19–32)
Chloride: 101 mEq/L (ref 96–112)
GFR calc Af Amer: >60 mL/min (ref >60)
GFR calc non Af Amer: >60 mL/min (ref >60)
Glucose, Bld: 88 mg/dL (ref 70–99)
Potassium: 3.2 mEq/L — ABNORMAL LOW (ref 3.5–5.1)
Sodium: 140 mEq/L (ref 135–145)
Total Bilirubin: 1.3 mg/dL — ABNORMAL HIGH (ref 0.3–1.2)
Total Protein: 5.9 g/dL — ABNORMAL LOW (ref 6.0–8.3)

## 2011-05-25 LAB — DIFFERENTIAL
Band Neutrophils: 0 % (ref 0–10)
Basophils Absolute: 0 10*3/uL (ref 0.0–0.1)
Basophils Relative: 0 % (ref 0–1)
Blasts: 0 %
Lymphs Abs: 2.1 10*3/uL (ref 0.7–4.0)
Metamyelocytes Relative: 0 %
Myelocytes: 0 %
Promyelocytes Absolute: 0 %

## 2011-05-25 LAB — BASIC METABOLIC PANEL
CO2: 30 mEq/L (ref 19–32)
Calcium: 10.3 mg/dL (ref 8.4–10.5)
GFR calc non Af Amer: >60 mL/min (ref >60)
Glucose, Bld: 102 mg/dL — ABNORMAL HIGH (ref 70–99)
Potassium: 4.7 mEq/L (ref 3.5–5.1)
Sodium: 139 mEq/L (ref 135–145)

## 2011-05-25 LAB — CBC
Hemoglobin: 10.5 g/dL — ABNORMAL LOW (ref 12.0–15.0)
MCH: 31.2 pg (ref 26.0–34.0)
Platelets: 222 10*3/uL (ref 150–400)
RBC: 3.37 MIL/uL — ABNORMAL LOW (ref 3.87–5.11)

## 2011-05-25 LAB — PROTIME-INR: Prothrombin Time: 17.8 seconds — ABNORMAL HIGH (ref 11.6–15.2)

## 2011-05-31 ENCOUNTER — Emergency Department (INDEPENDENT_AMBULATORY_CARE_PROVIDER_SITE_OTHER): Payer: Medicare Other

## 2011-05-31 ENCOUNTER — Emergency Department (HOSPITAL_BASED_OUTPATIENT_CLINIC_OR_DEPARTMENT_OTHER)
Admission: EM | Admit: 2011-05-31 | Discharge: 2011-05-31 | Disposition: A | Discharge status: Home / Self Care | Payer: Medicare Other | Attending: Emergency Medicine | Admitting: Emergency Medicine

## 2011-05-31 DIAGNOSIS — I4891 Unspecified atrial fibrillation: Secondary | ICD-10-CM | POA: Insufficient documentation

## 2011-05-31 DIAGNOSIS — I517 Cardiomegaly: Secondary | ICD-10-CM | POA: Insufficient documentation

## 2011-05-31 DIAGNOSIS — R0789 Other chest pain: Secondary | ICD-10-CM

## 2011-05-31 DIAGNOSIS — I10 Essential (primary) hypertension: Secondary | ICD-10-CM | POA: Insufficient documentation

## 2011-05-31 DIAGNOSIS — R071 Chest pain on breathing: Secondary | ICD-10-CM | POA: Insufficient documentation

## 2011-05-31 DIAGNOSIS — E785 Hyperlipidemia, unspecified: Secondary | ICD-10-CM | POA: Insufficient documentation

## 2011-05-31 DIAGNOSIS — Z79899 Other long term (current) drug therapy: Secondary | ICD-10-CM | POA: Insufficient documentation

## 2011-05-31 LAB — APTT: aPTT: 39 seconds — ABNORMAL HIGH (ref 24–37)

## 2011-05-31 LAB — CBC
HCT: 34.6 % — ABNORMAL LOW (ref 36.0–46.0)
Hemoglobin: 11.9 g/dL — ABNORMAL LOW (ref 12.0–15.0)
RBC: 3.72 MIL/uL — ABNORMAL LOW (ref 3.87–5.11)

## 2011-05-31 LAB — PROTIME-INR
INR: 1.39 (ref 0.00–1.49)
Prothrombin Time: 17.3 seconds — ABNORMAL HIGH (ref 11.6–15.2)

## 2011-05-31 LAB — DIFFERENTIAL
Basophils Absolute: 0 10*3/uL (ref 0.0–0.1)
Basophils Relative: 1 % (ref 0–1)
Lymphocytes Relative: 23 % (ref 12–46)
Neutro Abs: 4.3 10*3/uL (ref 1.7–7.7)
Neutrophils Relative %: 60 % (ref 43–77)

## 2011-05-31 LAB — BASIC METABOLIC PANEL
Chloride: 96 mEq/L (ref 96–112)
GFR calc Af Amer: >60 mL/min (ref >60)
GFR calc non Af Amer: >60 mL/min (ref >60)
Potassium: 4.6 mEq/L (ref 3.5–5.1)
Sodium: 132 mEq/L — ABNORMAL LOW (ref 135–145)

## 2011-06-14 HISTORY — PX: OTHER SURGICAL HISTORY: SHX169

## 2011-06-15 NOTE — Discharge Summary (Signed)
NAMEPORSCHIA, Mendoza NO.:  [id number]  MEDICAL RECORD NO.:  [id number]  LOCATION:  1425                         FACILITY:  [facility]  PHYSICIAN:  Ramiro Harvest, MD    DATE OF BIRTH:  1933-07-18  DATE OF ADMISSION:  05/17/2011 DATE OF DISCHARGE:                        DISCHARGE SUMMARY - REFERRING   PRIMARY CARE PHYSICIAN:  Dr. Andi Devon.  GASTROENTEROLOGIST:  Dr. Charna Elizabeth.  CARDIOLOGIST:  Dr. Alanda Amass.  DISCHARGE DIAGNOSES: 1. Postprandial nausea, vomiting, and abdominal cramping, improved. 2. Volume overload, improving. 3. Escherichia coli urinary tract infection. 4. Generalized weakness. 5. Fall. 6. Mild rhabdomyolysis. 7. Anemia of chronic disease. 8. Transaminitis. 9. Gastroesophageal reflux disease. 10.Obesity. 11.Hyperlipidemia. 12.History of coronary artery disease, status post permanent     pacemaker. 13.Chronic anticoagulation, on atrial fibrillation.  Discharge medications will be dictated per discharging physician.  Disposition and followup will also be dictated per discharging physician.  However, post discharge, the patient will need to follow up with Dr. Loreta Ave of Gastroenterology 1 week post discharge.  She also need to follow up with Dr. Loreta Ave to follow up on a postprandial nausea and vomiting.  The patient also need to follow up with her PCP, Dr. Andi Devon 1 to 2 weeks post discharge and on followup with her PCP, the patient will need a BMET checked as well as a CBC to follow up on electrolytes and renal function.  She will also need a hepatic panel done to follow up on LFTs and transaminitis as the patient's Multaq was discontinued as it was felt it was causing her transaminitis.  The patient will also need to follow up with her cardiologist, Dr. Alanda Amass 1 to 2 weeks post discharge to follow up on her cardiac issues.  During this hospitalization, the patient's Multaq was discontinued secondary to elevated LFTs.   She has been on her Lopressor at increased dose and diltiazem for rate control.  CONSULTATIONS DONE: 1. A cardiology consultation was done.  The patient was seen in     consultation by Dr. Nanetta Batty of [facility]     [facility] on May 18, 2011. 2. The patient was also seen by Dr. Russella Dar of [facility]     on May 21, 2011.  PROCEDURES PERFORMED: 1. A chest x-ray was done, May 17, 2011 that showed enlargement of     cardiac silhouette with slight pulmonary vascular congestion and     prior pacemaker placement and bronchitic changes. 2. CT of the head without contrast was done, May 18, 2011 that showed     atrophy and mild small vessel chronic ischemic changes of deep     cerebral white matter.  No acute abnormalities. 3. CT of the abdomen and pelvis done, May 19, 2011, showed no acute     abdominal/pelvic findings. 4. Chest x-ray done, May 21, 2011, showed mild congestive heart     failure pattern. 5. Chest x-ray done, May 24, 2011, shows stable cardiomegaly and     pulmonary venous hypertension.  No acute findings.  ADMISSION HISTORY AND PHYSICAL:  Jacqueline Mendoza is a 77 year old African American female, history of atrial fibrillation, coronary artery disease, hypertension, GERD, obesity, and hyperlipidemia,  presented to the ED with weakness and fall.  The patient reported that 3 weeks prior to admission, her cardiologist, Dr. Alanda Amass had adjusted her cardiac medications, took her off amiodarone and placed her on dronedarone. Since then she had noted intermittent episodes of nausea and has had episodes of vomiting.  The patient also recently vomited 2 days prior to admission, vomited whatever she ate.  On the morning of admission, she stood up to go to the bathroom, felt very weak and had her knees buckled.  She fell to the floor, gently did not hit her head and did not lose consciousness.  As a result, EMS was called.  In the ED, the patient  was thought to be dehydrated.  She was given gentle fluids and found to have urinary tract infection.  Hospice service was called and consulted to help manage her further.  The patient denied any fever. She did complain of intermittent chills, did have intermittent nausea and 2 episodes of emesis over the past 3 weeks prior to admission.  The patient did complain about chest pain that she stated lasted about 1 to 1-1/2 seconds, sharp in nature, quickly resolved and greater than a week ago.  The patient denied any jaw pain.  No back pain.  No abdominal pain or diarrhea.  No headaches or visual changes.  For the rest of admission history and physical, please see H and P dictated by Dr. Betti Cruz of job [phone].  HOSPITAL COURSE: 1. Postprandial nausea and vomiting.  The patient initially was     admitted with weakness and a fall.  It was felt secondary to acute     urinary tract infection in the setting of dehydration and volume     depletion.  The patient per family, had not had good oral intake     over the past month prior to admission.  It was stated that the     patient did have some postprandial nausea, vomiting, and abdominal    cramping and pain and as such, had had decreased oral intake.  A GI     consultation was obtained.  The patient was seen in consultation by     [facility], Dr. Russella Dar on behalf of Dr. Loreta Ave and at that time, the     patient did have a CT of the abdomen and pelvis, which was done,     which did not show any ischemic changes in the mesentery.  It was     felt that the patient may require an endoscopy with biopsy and as     such her Coumadin was held until INR became subtherapeutic.  The     patient was monitored and followed.  The patient improved     clinically.  The patient was subsequently followed up upon by Dr.     Loreta Ave on May 23, 2011 and per her gastroenterologist at that time,     it was felt the patient was doing much better today.  Her nausea     has  significantly improved since admission.  The patient was     tolerating p.o.'s.  The patient did have some history of diarrhea.     Her Clostridium difficile was negative and that was improving     somewhat.  It was felt that most of the patient's symptoms were     likely secondary to her acute urinary tract infection.  It was felt     that an endoscopy was not warranted  at this time and just to treat     supportively and the patient to follow up with Dr. Loreta Ave of     Gastroenterology as an outpatient.  The patient improved clinically     and is currently in stable and improved condition. 2. Volume overload.  On admission, the patient was noted to be     hypotensive, dehydrated, and volume depleted.  The patient was     hydrated with IV fluids.  Cardiac enzymes, which were cycled were     negative x3.  The patient received aggressive IV fluids as she was     orthostatic on admission with low blood pressure with dizziness and     weakness.  The patient had improved clinically.  However, the     patient was noted to have crackles observed on physical exam as     well as lower extremity edema.  Chest x-ray, which was done was     consistent with mild congestive heart failure.  It was felt that     the patient's overload was secondary to aggressive hydration.  The     patient was diuresed with IV Lasix with significant clinical     improvement.  The patient will be followed with IV Lasix on an as     needed basis.  The patient is currently in stable and improved     condition. 3. Escherichia coli urinary tract infection.  On admission, the     patient was noted to have urinary tract infection, which was felt     to be the etiology of most of her symptoms and urine cultures,     which were drawn came back with greater than 100,000 of Escherichia     coli.  The patient was initially placed on IV Rocephin and was     subsequently transitioned to oral ciprofloxacin.  The patient will     complete  a 1-week course of oral antibiotics for urinary tract     infection and will follow up with her PCP as an outpatient. 4. Mild rhabdomyolysis.  On admission, the patient was noted to have     mild rhabdomyolysis, which was felt to be secondary to a fall.  The     patient was hydrated with IV fluids with improvement. 5. Atrial fibrillation remained stable during the hospitalization.     The patient remained in normal sinus rhythm.  She was rate     controlled.  Initially, the patient was placed on diltiazem,     Lopressor as well as Multaq; however, secondary to transaminitis,     it was felt that this Multaq was likely etiology of the patient's     transaminitis.  Multaq was discontinued.  The patient's Lopressor     was increased.  She was maintained on Lopressor and diltiazem for     rate control.  Her Coumadin was initially held as there were some     concerns of possible upper endoscopy with biopsy; however endoscopy     was cancelled.  It was felt it was not needed during this time.     She has been resumed back on her Coumadin and INR levels are being     followed.  This will need to be followed up as an outpatient.  The     patient's Multaq has been discontinued and she will need to follow     up with her cardiologist as an outpatient. 6. Transaminitis.  During the hospitalization, the patient was noted     to have transaminitis.  It was initially felt to possibly be from     shock liver as the patient did come in hypotensive; however, she     was volume repleted.  An acute hepatitis panel, which was obtained     was negative.  CT of the abdomen and pelvis, which was also     obtained was unremarkable.  It was felt the patient's transaminitis     was likely secondary to Multaq.  The patient's Multaq was     subsequently discontinued and her Lopressor dose increased.  The     patient's transaminitis improved on a daily basis and when she     follows up as an outpatient, she will need  a hepatic panel drawn to     follow up on transaminitis and Multaq has currently been     discontinued and will need to follow up with her cardiologist as an     outpatient as to whether to resume her Multaq or stay off her     Multaq.  The patient's statin was also held secondary to     transaminitis.  The patient's transaminitis has improved and she is     currently in stable condition.  Disposition and followup as well as discharge medications will be dictated per discharging physician.  It has been a pleasure taking care of Jacqueline Mendoza.     Ramiro Harvest, MD     DT/MEDQ  D:  05/24/2011  T:  05/24/2011  Job:  [id number]  cc:   Merlene Laughter. Renae Gloss, M.D. Fax: [phone]  JXBJYN WGN FAOZ, MD, [facility] Fax: [phone]  Richard A. Alanda Amass, M.D. Fax: [phone]  Electronically Signed by Ramiro Harvest MD on 06/15/2011 28:41:32 PM

## 2012-11-05 ENCOUNTER — Other Ambulatory Visit (HOSPITAL_COMMUNITY): Payer: Self-pay | Admitting: Cardiovascular Disease

## 2012-11-05 DIAGNOSIS — I509 Heart failure, unspecified: Secondary | ICD-10-CM

## 2012-11-05 DIAGNOSIS — Z950 Presence of cardiac pacemaker: Secondary | ICD-10-CM

## 2012-11-14 ENCOUNTER — Ambulatory Visit (HOSPITAL_COMMUNITY)
Admission: RE | Admit: 2012-11-14 | Discharge: 2012-11-14 | Disposition: A | Discharge status: Home / Self Care | Payer: Medicare Other | Source: Ambulatory Visit | Attending: Cardiovascular Disease | Admitting: Cardiovascular Disease

## 2012-11-14 DIAGNOSIS — I4891 Unspecified atrial fibrillation: Secondary | ICD-10-CM | POA: Insufficient documentation

## 2012-11-14 DIAGNOSIS — I359 Nonrheumatic aortic valve disorder, unspecified: Secondary | ICD-10-CM | POA: Insufficient documentation

## 2012-11-14 DIAGNOSIS — R609 Edema, unspecified: Secondary | ICD-10-CM | POA: Insufficient documentation

## 2012-11-14 DIAGNOSIS — R0609 Other forms of dyspnea: Secondary | ICD-10-CM | POA: Insufficient documentation

## 2012-11-14 DIAGNOSIS — I509 Heart failure, unspecified: Secondary | ICD-10-CM | POA: Insufficient documentation

## 2012-11-14 DIAGNOSIS — Z950 Presence of cardiac pacemaker: Secondary | ICD-10-CM | POA: Insufficient documentation

## 2012-11-14 DIAGNOSIS — R0989 Other specified symptoms and signs involving the circulatory and respiratory systems: Secondary | ICD-10-CM | POA: Insufficient documentation

## 2012-11-14 DIAGNOSIS — E669 Obesity, unspecified: Secondary | ICD-10-CM | POA: Insufficient documentation

## 2012-11-14 DIAGNOSIS — E785 Hyperlipidemia, unspecified: Secondary | ICD-10-CM | POA: Insufficient documentation

## 2012-11-14 DIAGNOSIS — I517 Cardiomegaly: Secondary | ICD-10-CM | POA: Insufficient documentation

## 2012-11-14 DIAGNOSIS — I10 Essential (primary) hypertension: Secondary | ICD-10-CM | POA: Insufficient documentation

## 2012-11-14 HISTORY — PX: TRANSTHORACIC ECHOCARDIOGRAM: SHX275

## 2012-11-14 NOTE — Progress Notes (Signed)
Jacqueline Mendoza   2D echo completed 11/14/2012.   Cindy Dagny Fiorentino, RDCS   

## 2013-02-06 IMAGING — CR DG CHEST 2V
1 series · 1 of 1 positions shown · non-contrast
Comparison: 03/19/2007

CLINICAL DATA: Altercation, hypertension, atrial fibrillation,
pacemaker

CHEST - 2 VIEW

[view not recorded]
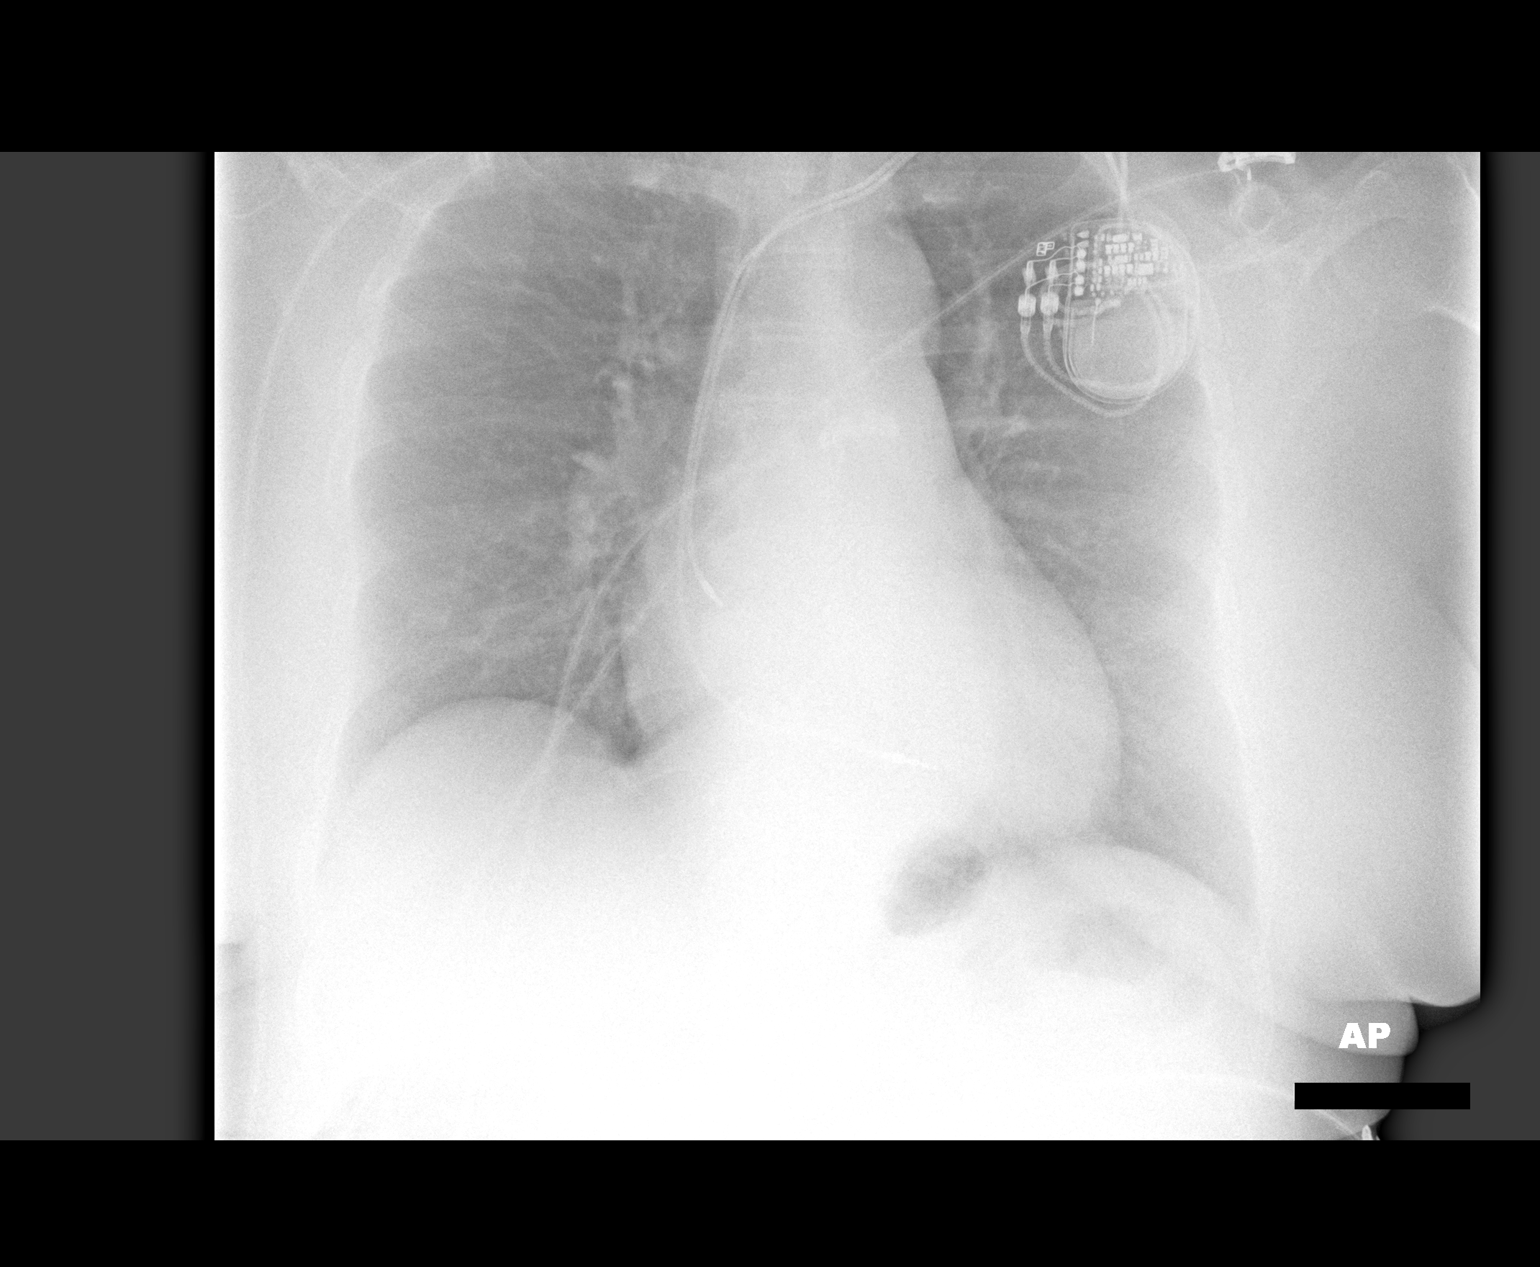

[1 of 1 positions shown; findings below may reference images not displayed]

FINDINGS: Left subclavian sequential transvenous pacemaker leads project at
right atrium and right ventricle.
Enlargement of cardiac silhouette.
Tortuous aorta.
Slight pulmonary vascular congestion.
Grainy technique on AP view.
Bronchitic changes without infiltrate or effusion.
No pneumothorax or acute osseous findings.
IMPRESSION: Enlargement of cardiac silhouette with slight pulmonary vascular
congestion and prior pacemaker placement.
Bronchitic changes.

## 2013-02-10 IMAGING — CR DG CHEST 2V
2 series · 2 of 2 positions shown · non-contrast
Comparison: Chest x-ray 05/17/2011.

CLINICAL DATA: Increased BNP.  Rales on exam.

CHEST - 2 VIEW

[w chest lat *]
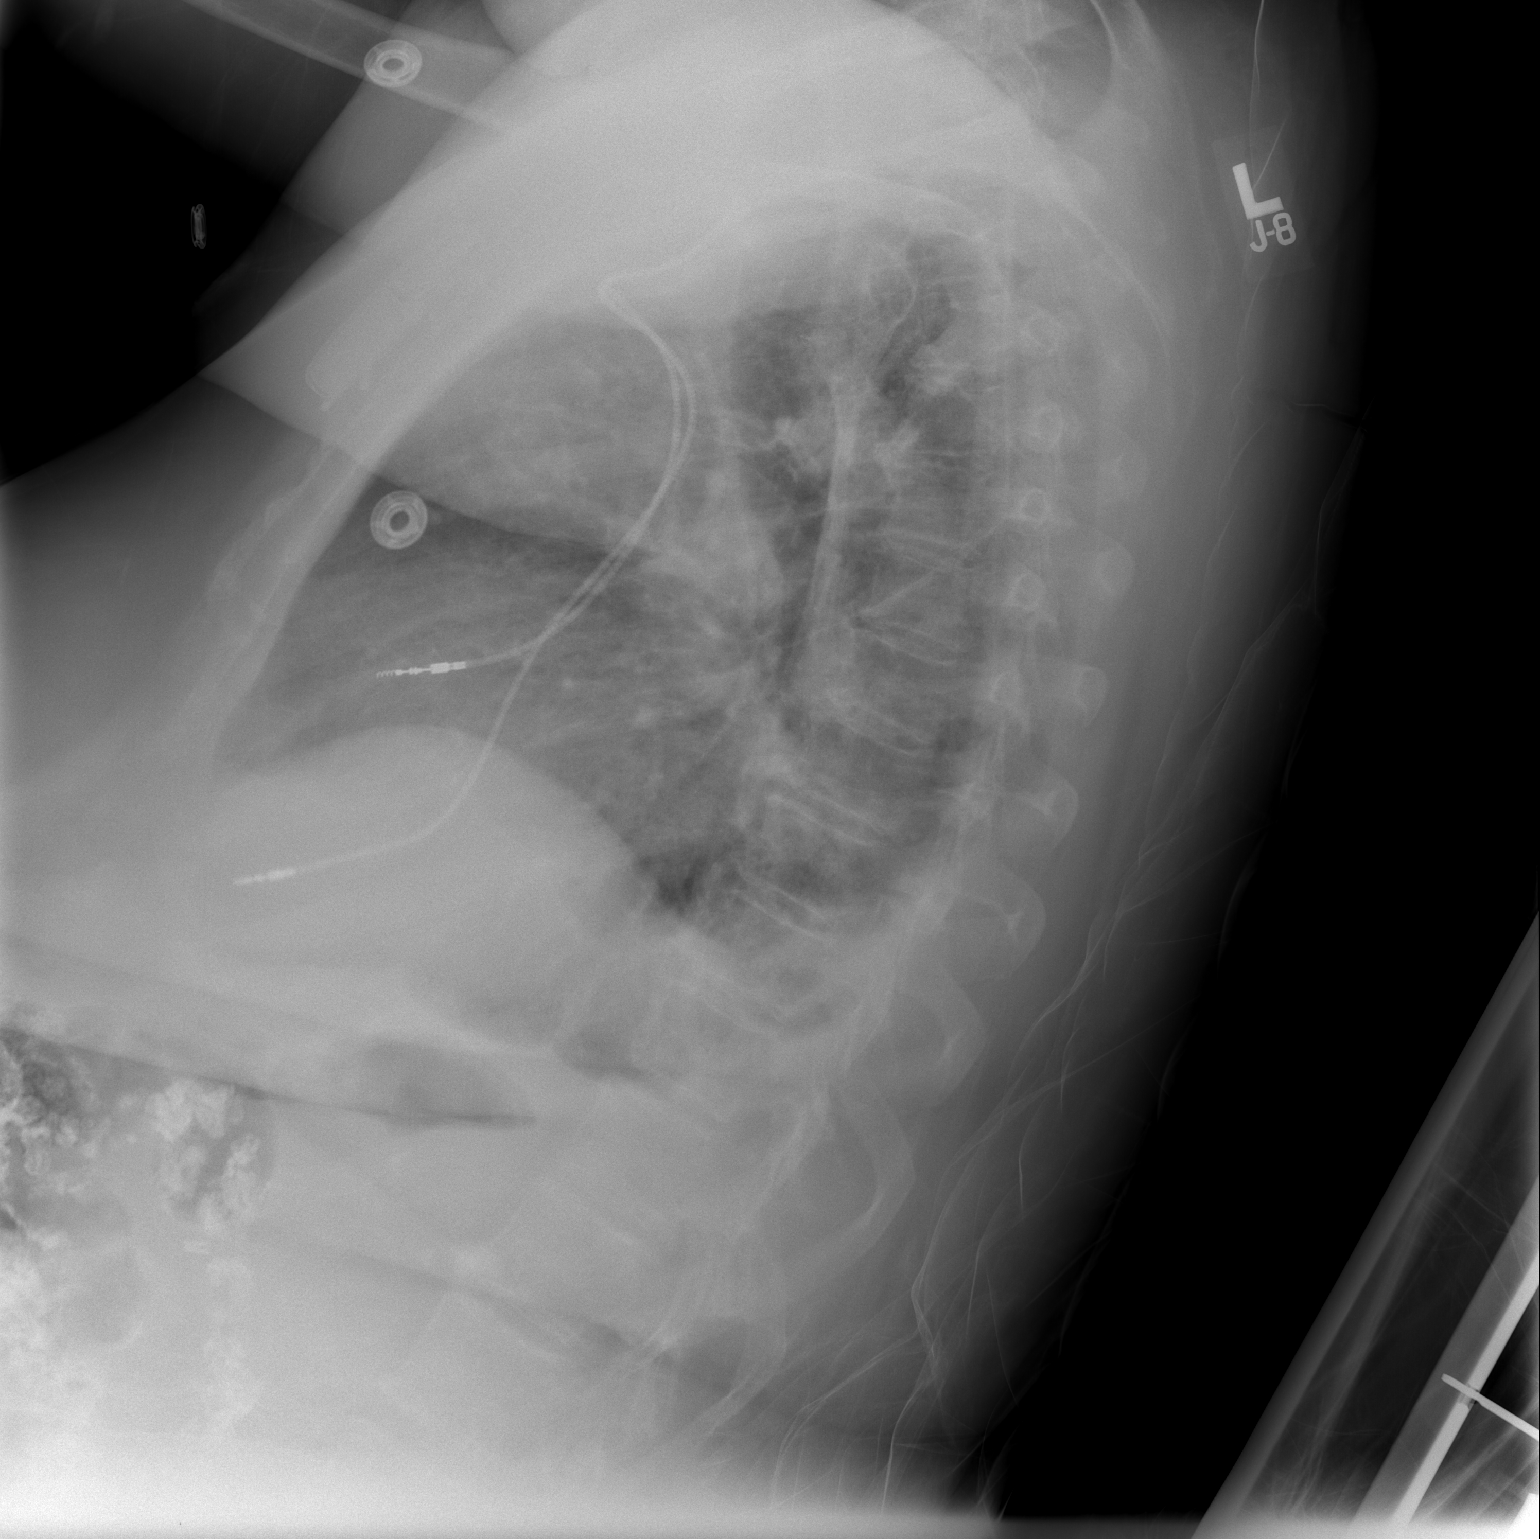

[view not recorded]
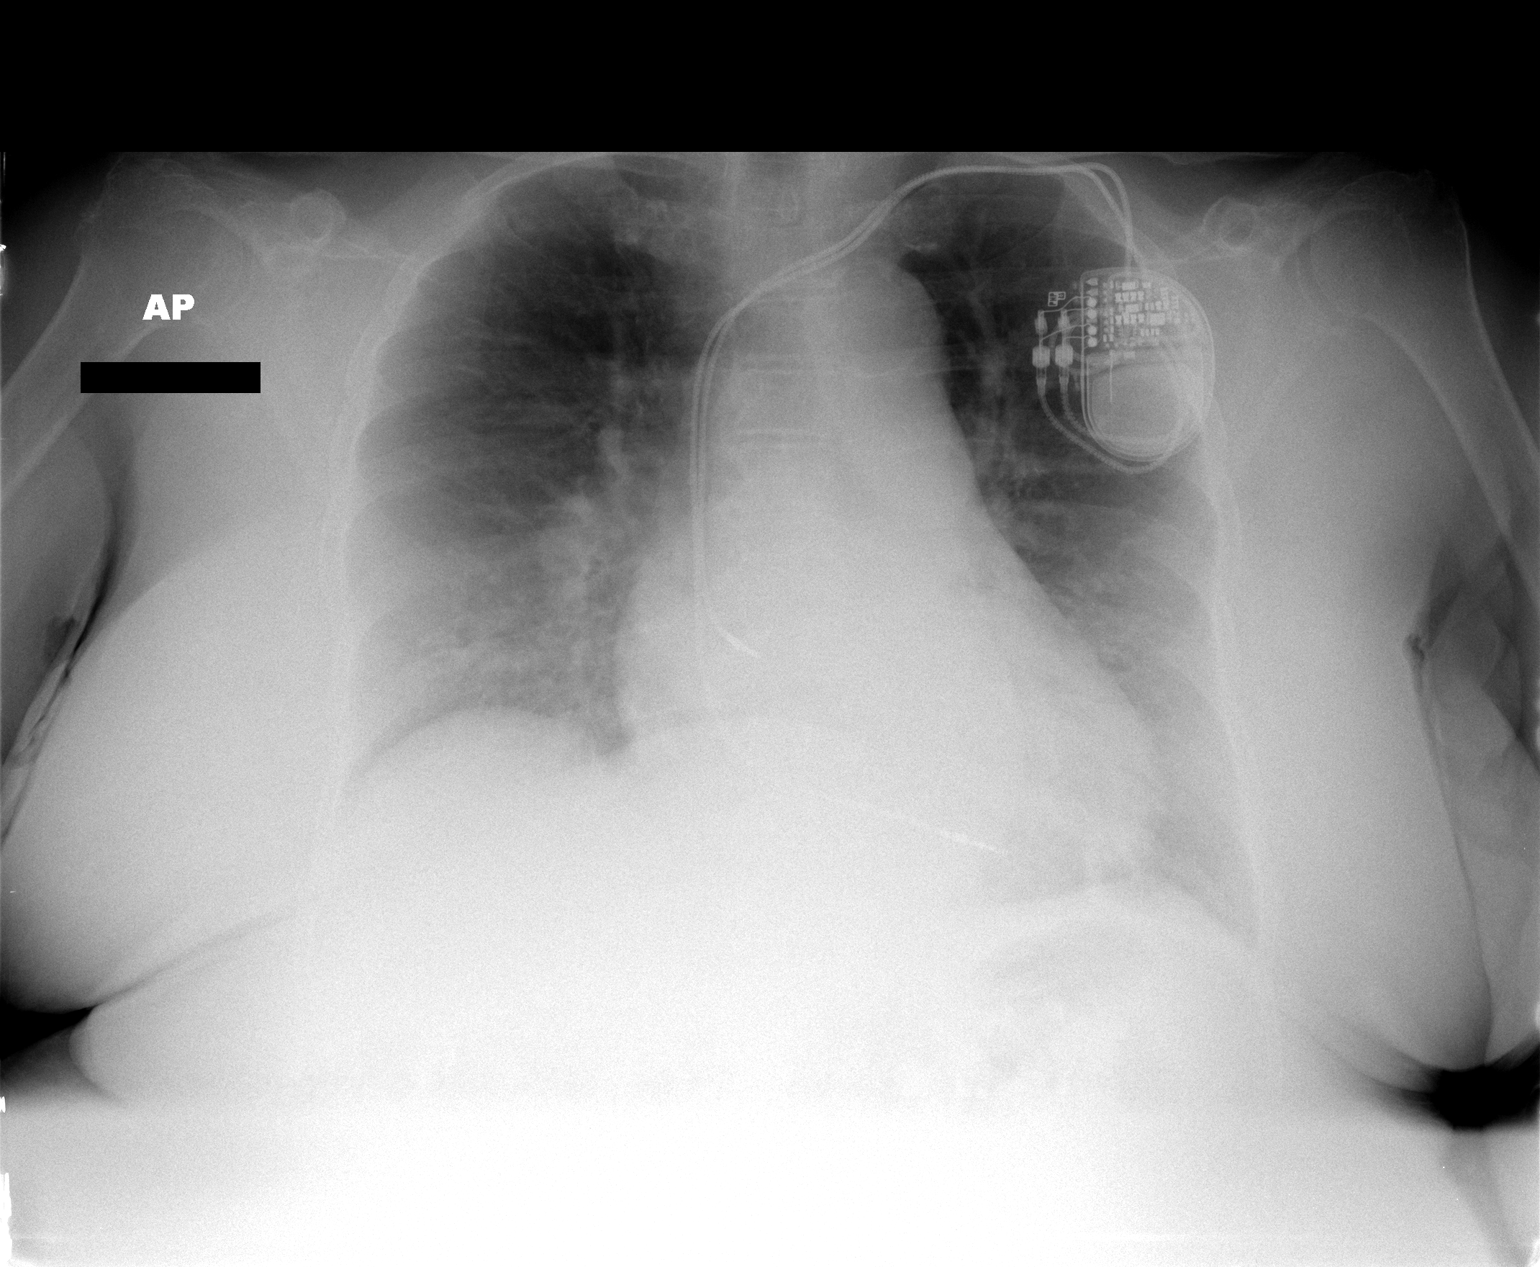

[2 of 2 positions shown; findings below may reference images not displayed]

FINDINGS: Stable mild cardiomegaly.  Left subclavian dual lead
pacer is stable.  There is pulmonary vascular congestion.
Perihilar interstitial prominence is noted.  Both costophrenic
angles are blunted posteriorly.  No acute bony abnormality.
IMPRESSION: Mild congestive heart failure pattern.

## 2013-03-16 ENCOUNTER — Encounter: Payer: Self-pay | Admitting: Pharmacist Clinician (PhC)/ Clinical Pharmacy Specialist

## 2013-03-16 DIAGNOSIS — Z7901 Long term (current) use of anticoagulants: Secondary | ICD-10-CM | POA: Insufficient documentation

## 2013-03-16 DIAGNOSIS — I4891 Unspecified atrial fibrillation: Secondary | ICD-10-CM

## 2013-04-24 ENCOUNTER — Ambulatory Visit (INDEPENDENT_AMBULATORY_CARE_PROVIDER_SITE_OTHER): Payer: Medicare Other | Admitting: Pharmacist Clinician (PhC)/ Clinical Pharmacy Specialist

## 2013-04-24 VITALS — BP 116/70 | HR 68

## 2013-04-24 DIAGNOSIS — Z7901 Long term (current) use of anticoagulants: Secondary | ICD-10-CM

## 2013-04-24 DIAGNOSIS — I4891 Unspecified atrial fibrillation: Secondary | ICD-10-CM

## 2013-04-24 LAB — POCT INR: INR: 3.5

## 2013-05-09 ENCOUNTER — Ambulatory Visit (INDEPENDENT_AMBULATORY_CARE_PROVIDER_SITE_OTHER): Payer: Medicare Other | Admitting: Pharmacist Clinician (PhC)/ Clinical Pharmacy Specialist

## 2013-05-09 DIAGNOSIS — Z7901 Long term (current) use of anticoagulants: Secondary | ICD-10-CM

## 2013-05-09 DIAGNOSIS — I4891 Unspecified atrial fibrillation: Secondary | ICD-10-CM

## 2013-05-09 LAB — POCT INR: INR: 2.2

## 2013-05-10 ENCOUNTER — Encounter (HOSPITAL_COMMUNITY): Payer: Self-pay | Admitting: Pharmacy Technician

## 2013-05-14 ENCOUNTER — Other Ambulatory Visit: Payer: Self-pay | Admitting: Cardiovascular Disease

## 2013-05-14 ENCOUNTER — Other Ambulatory Visit: Payer: Self-pay | Admitting: *Deleted

## 2013-05-14 ENCOUNTER — Telehealth: Payer: Self-pay | Admitting: *Deleted

## 2013-05-14 DIAGNOSIS — I4891 Unspecified atrial fibrillation: Secondary | ICD-10-CM

## 2013-05-14 NOTE — Telephone Encounter (Signed)
Jacqueline Mendoza informed me that a UA was ordered for Ms Kaufman, but Ms Ochs was unable to void.  A UA was not collected

## 2013-05-16 MED ORDER — GENTAMICIN SULFATE 40 MG/ML IJ SOLN
80.0000 mg | INTRAMUSCULAR | Status: DC
  Filled 2013-05-16: qty 2

## 2013-05-16 MED ORDER — CEFAZOLIN SODIUM-DEXTROSE 2-3 GM-% IV SOLR
2.0000 g | INTRAVENOUS | Status: DC
  Filled 2013-05-16 (×2): qty 50

## 2013-05-17 ENCOUNTER — Encounter (HOSPITAL_COMMUNITY)
Admission: RE | Disposition: A | Discharge status: Home / Self Care | Payer: Self-pay | Source: Ambulatory Visit | Attending: Cardiovascular Disease

## 2013-05-17 ENCOUNTER — Ambulatory Visit (HOSPITAL_COMMUNITY)
Admission: RE | Admit: 2013-05-17 | Discharge: 2013-05-17 | Disposition: A | Discharge status: Home / Self Care | Payer: Medicare Other | Source: Ambulatory Visit | Attending: Cardiovascular Disease | Admitting: Cardiovascular Disease

## 2013-05-17 DIAGNOSIS — Z45018 Encounter for adjustment and management of other part of cardiac pacemaker: Secondary | ICD-10-CM | POA: Insufficient documentation

## 2013-05-17 DIAGNOSIS — Z45010 Encounter for checking and testing of cardiac pacemaker pulse generator [battery]: Secondary | ICD-10-CM

## 2013-05-17 DIAGNOSIS — I498 Other specified cardiac arrhythmias: Secondary | ICD-10-CM

## 2013-05-17 DIAGNOSIS — I10 Essential (primary) hypertension: Secondary | ICD-10-CM | POA: Insufficient documentation

## 2013-05-17 DIAGNOSIS — I872 Venous insufficiency (chronic) (peripheral): Secondary | ICD-10-CM | POA: Insufficient documentation

## 2013-05-17 DIAGNOSIS — E669 Obesity, unspecified: Secondary | ICD-10-CM | POA: Insufficient documentation

## 2013-05-17 DIAGNOSIS — Z79899 Other long term (current) drug therapy: Secondary | ICD-10-CM | POA: Insufficient documentation

## 2013-05-17 DIAGNOSIS — Z7901 Long term (current) use of anticoagulants: Secondary | ICD-10-CM | POA: Insufficient documentation

## 2013-05-17 DIAGNOSIS — I495 Sick sinus syndrome: Secondary | ICD-10-CM | POA: Insufficient documentation

## 2013-05-17 DIAGNOSIS — R001 Bradycardia, unspecified: Secondary | ICD-10-CM

## 2013-05-17 DIAGNOSIS — I4891 Unspecified atrial fibrillation: Secondary | ICD-10-CM | POA: Insufficient documentation

## 2013-05-17 DIAGNOSIS — E785 Hyperlipidemia, unspecified: Secondary | ICD-10-CM | POA: Insufficient documentation

## 2013-05-17 DIAGNOSIS — Z6836 Body mass index (BMI) 36.0-36.9, adult: Secondary | ICD-10-CM | POA: Insufficient documentation

## 2013-05-17 HISTORY — PX: PERMANENT PACEMAKER GENERATOR CHANGE: SHX6022

## 2013-05-17 HISTORY — PX: PACEMAKER GENERATOR CHANGE: SHX5998

## 2013-05-17 LAB — BASIC METABOLIC PANEL
Calcium: 10 mg/dL (ref 8.4–10.5)
GFR calc non Af Amer: 28 mL/min — ABNORMAL LOW (ref >90)
Glucose, Bld: 105 mg/dL — ABNORMAL HIGH (ref 70–99)
Potassium: 3.9 mEq/L (ref 3.5–5.1)
Sodium: 141 mEq/L (ref 135–145)

## 2013-05-17 LAB — CBC
Hemoglobin: 15.5 g/dL — ABNORMAL HIGH (ref 12.0–15.0)
MCH: 32.8 pg (ref 26.0–34.0)
MCHC: 35.4 g/dL (ref 30.0–36.0)
Platelets: 159 10*3/uL (ref 150–400)
RBC: 4.73 MIL/uL (ref 3.87–5.11)

## 2013-05-17 LAB — SURGICAL PCR SCREEN: MRSA, PCR: NEGATIVE

## 2013-05-17 LAB — PROTIME-INR
INR: 1.17 (ref 0.00–1.49)
Prothrombin Time: 14.7 seconds (ref 11.6–15.2)

## 2013-05-17 SURGERY — PERMANENT PACEMAKER GENERATOR CHANGE
Anesthesia: LOCAL

## 2013-05-17 MED ORDER — SODIUM CHLORIDE 0.9 % IV SOLN
INTRAVENOUS | Status: DC
  Administered 2013-05-17: 12:00:00 via INTRAVENOUS

## 2013-05-17 MED ORDER — MUPIROCIN 2 % EX OINT: TOPICAL_OINTMENT | Freq: Once | CUTANEOUS | Status: AC

## 2013-05-17 MED ORDER — SODIUM CHLORIDE 0.9 % IV SOLN: INTRAVENOUS | Status: AC

## 2013-05-17 MED ORDER — ACETAMINOPHEN 325 MG PO TABS: 325.0000 mg | ORAL_TABLET | ORAL | Status: DC | PRN

## 2013-05-17 MED ORDER — MIDAZOLAM HCL 5 MG/5ML IJ SOLN
INTRAMUSCULAR | Status: AC
  Filled 2013-05-17: qty 5

## 2013-05-17 MED ORDER — FENTANYL CITRATE 0.05 MG/ML IJ SOLN
INTRAMUSCULAR | Status: AC
  Filled 2013-05-17: qty 2

## 2013-05-17 MED ORDER — LIDOCAINE HCL (PF) 1 % IJ SOLN
INTRAMUSCULAR | Status: AC
  Filled 2013-05-17: qty 60

## 2013-05-17 MED ORDER — SODIUM CHLORIDE 0.9 % IJ SOLN: 3.0000 mL | INTRAMUSCULAR | Status: DC | PRN

## 2013-05-17 MED ORDER — MUPIROCIN 2 % EX OINT
TOPICAL_OINTMENT | CUTANEOUS | Status: AC
  Administered 2013-05-17: 1 via NASAL
  Filled 2013-05-17: qty 22

## 2013-05-17 MED ORDER — ONDANSETRON HCL 4 MG/2ML IJ SOLN: 4.0000 mg | Freq: Four times a day (QID) | INTRAMUSCULAR | Status: DC | PRN

## 2013-05-17 NOTE — H&P (Signed)
  Date of Initial H&P: Dr. Alanda Amass  History reviewed, patient examined, no change in status, stable for surgery. Symptomatic bradycardia (AF with slow VR) and pacemaker at [facility]. Here for elective generator change. This procedure has been fully reviewed with the patient and written informed consent has been obtained. Thurmon Fair, MD, [facility] [phone] office [phone] pager

## 2013-05-17 NOTE — Op Note (Signed)
Procedure report  Procedure performed:  1. dual-chamber permanent pacemaker generator changeout  2. Light sedation  Reason for procedure:  1. Device generator at elective replacement interval 2. Symptomatic bradycardia ; atrial fibrillation with slow ventricular response Procedure performed by:  Thurmon Fair, MD  Complications:  None  Estimated blood loss:  <5 mL  Medications administered during procedure:  Ancef 2 g intravenously,  lidocaine 1% 30 mL locally, fentanyl 25 mcg intravenously, Versed 1 mg intravenously Device details:   New Generator MedtronicAdapta model number ADDR L1, serial number [id number] Right atrial lead (chronic) Medtronic [phone], serial numberPJN [id number] (implanted March 2008) Right ventricular lead (chronic)  Medtronic [id number], serial number PJN [id number] (implanted March 2008)  Explanted generator Medtronic [place],  model number P1501 DR, serial number  PNP [id number] (implanted March 2008)  Procedure details:  After the risks and benefits of the procedure were discussed the patient provided informed consent. She was brought to the cardiac catheter lab in the fasting state. The patient was prepped and draped in usual sterile fashion. Local anesthesia with 1% lidocaine was administered to to the left infraclavicular area. A 5-6cm horizontal incision was made parallel with and 2-3 cm caudal to the left clavicle, in the area of an old scar. Using minimal electrocautery and mostly sharp and blunt dissection the prepectoral pocket was opened carefully to avoid injury to the loops of chronic leads. Extensive dissection was not necessary. The device was explanted. The pocket was carefully inspected for hemostasis and flushed with copious amounts of antibiotic solution.  The leads were disconnected from the old generator and testing of the lead parameters later showed excellent values. The new generator was connected to the chronic leads, with appropriate pacing noted.    The entire system was then carefully inserted in the pocket with care been taking that the leads and device assumed a comfortable position without pressure on the incision. Great care was taken that the leads be located deep to the generator. The pocket was then closed in layers using 2 layers of 2-0 Vicryl and cutaneous staples after which a sterile dressing was applied.   At the end of the procedure the following lead parameters were encountered:   Right atrial lead - atrial fibrillation Right ventricular lead sensed R waves  19 mV, impedance 536 ohms, threshold 0.9 at 0.5 ms pulse width.  Thurmon Fair, MD, [facility] [phone] office [phone] pager

## 2013-05-20 ENCOUNTER — Telehealth: Payer: Self-pay | Admitting: Cardiovascular Disease

## 2013-05-20 NOTE — Telephone Encounter (Signed)
Mrs.Michalowski has switch pharmacies and needs a new prescription for her (Allopurinol-100mg  tablet once a day) sent to [facility] on [address] in [place].   Thanks

## 2013-05-21 MED ORDER — ALLOPURINOL 100 MG PO TABS: 100.0000 mg | ORAL_TABLET | Freq: Every day | ORAL | Status: DC

## 2013-05-21 NOTE — Telephone Encounter (Signed)
Refill(s) sent to pharmacy.  Returned call.  Pt's daughter informed Rx sent and of refill process and verbalized understanding.  Dr. Matilde Haymaker. Alvstad, PharmD will be notified on possible interaction between allopurinol and warfarin.

## 2013-05-21 NOTE — Telephone Encounter (Signed)
Message forwarded to [facility]. Berlinda Last, LPN to discuss w/ Dr. Alanda Amass.  This note and paper [phone] placed on Dr. Kandis Cocking cart.  Message forwarded to K. Alvstad, PharmD.

## 2013-05-24 ENCOUNTER — Encounter: Payer: Self-pay | Admitting: Physician Assistant

## 2013-05-24 ENCOUNTER — Ambulatory Visit (INDEPENDENT_AMBULATORY_CARE_PROVIDER_SITE_OTHER): Payer: Medicare Other | Admitting: Physician Assistant

## 2013-05-24 VITALS — BP 132/86 | HR 72 | Ht 63.5 in | Wt 206.9 lb

## 2013-05-24 DIAGNOSIS — Z45010 Encounter for checking and testing of cardiac pacemaker pulse generator [battery]: Secondary | ICD-10-CM

## 2013-05-24 DIAGNOSIS — Z45018 Encounter for adjustment and management of other part of cardiac pacemaker: Secondary | ICD-10-CM

## 2013-05-24 NOTE — Assessment & Plan Note (Signed)
Recent generator change. Patient presented for wound site check staple removal. She'll followup with Dr. Alanda Amass for pacer check in 4 weeks.

## 2013-05-24 NOTE — Patient Instructions (Signed)
Follow up with Dr. Alanda Amass in one month for pacer check

## 2013-05-24 NOTE — Progress Notes (Signed)
Date:  05/24/2013   ID:  KHRISTIE SAK, DOB Dec 25, 1932, MRN [id number]  PCP:  Alva Garnet., MD  Primary Cardiologist:  Alanda Amass    History of Present Illness: Jacqueline Mendoza is a 79 y.o. female [place]. She is a Engineer, civil (consulting) for 57 years. His history of chronic pacemaker which was approaching ERI. 2 subsequent set up for generator change out with Dr. Royann Shivers.  Her history also includes hypertension, hyperlipidemia, sick sinus syndrome, chronic atrial fibrillation.  She had normal coronary arteries in 2008 with normal EF on echo in 2013. There is no significant valve disease. She is a pacer induced left bundle.  The patient presented today for wound site check and staple removal. She reports no fever, nausea, vomiting or chest pain.  New Generator MedtronicAdapta model number ADDR L1, serial number [id number]  Right atrial lead (chronic) Medtronic [id number], serial numberPJN [id number] (implanted March 2008)  Right ventricular lead (chronic) Medtronic [id number], serial number PJN [id number] (implanted March 2008)  Explanted generator Medtronic [place], model number P1501 DR, serial number PNP [id number] (implanted March 2008)  Wt Readings from Last 3 Encounters:  05/24/13 206 lb 14.4 oz (93.849 kg)  05/17/13 186 lb (84.369 kg)  05/17/13 186 lb (84.369 kg)     No past medical history on file.  Current Outpatient Prescriptions  Medication Sig Dispense Refill  . allopurinol (ZYLOPRIM) 100 MG tablet Take 1 tablet (100 mg total) by mouth daily.  30 tablet  4  . aspirin 81 MG tablet Take 81 mg by mouth daily.      Marland Kitchen co-enzyme Q-10 30 MG capsule Take 30 mg by mouth daily.      . colchicine 0.6 MG tablet Take 0.6 mg by mouth daily.      Marland Kitchen darifenacin (ENABLEX) 15 MG 24 hr tablet Take 15 mg by mouth daily.      . metoprolol (LOPRESSOR) 50 MG tablet Take 50 mg by mouth 2 (two) times daily.      . Multiple Vitamin (MULTIVITAMIN WITH MINERALS) TABS Take 1 tablet by mouth daily.      Marland Kitchen  omega-3 acid ethyl esters (LOVAZA) 1 G capsule Take 1 g by mouth daily.      Marland Kitchen omeprazole (PRILOSEC OTC) 20 MG tablet Take 20 mg by mouth 2 (two) times daily.      . potassium chloride SA (K-DUR,KLOR-CON) 20 MEQ tablet Take 20 mEq by mouth daily.      . simethicone (MYLICON) 125 MG chewable tablet Chew 125 mg by mouth daily.      . simvastatin (ZOCOR) 40 MG tablet Take 40 mg by mouth every evening.      . torsemide (DEMADEX) 20 MG tablet Take 20 mg by mouth daily.      Marland Kitchen warfarin (COUMADIN) 5 MG tablet Take 2.5 mg by mouth daily.       No current facility-administered medications for this visit.    Allergies:    Allergies  Allergen Reactions  . Quinine Derivatives Anaphylaxis    Social History:  The patient  reports that she has never smoked. She does not have any smokeless tobacco history on file. She reports that  drinks alcohol.   ROS:  Please see the history of present illness.  All other systems reviewed and negative.   PHYSICAL EXAM: VS:  BP 132/86  Pulse 72  Ht 5' 3.5" (1.613 m)  Wt 206 lb 14.4 oz (93.849 kg)  BMI 36.07 kg/m2 Obese, well developed, in  no acute distress HEENT: Pupils are equal round react to light accommodation extraocular movements are intact.  Cardiac: Regular rate and rhythm without murmurs rubs or gallops. Lungs:  clear to auscultation bilaterally, no wheezing, rhonchi or rales Skin: warm and dry, pacer site was nontender with no erythema no edema no ecchymosis. Neuro:  Grossly normal    ASSESSMENT AND PLAN:  Problem List Items Addressed This Visit   Pacemaker at end of battery life - Primary     Recent generator change. Patient presented for wound site check staple removal. She'll followup with Dr. Alanda Amass for pacer check in 4 weeks.

## 2013-06-17 ENCOUNTER — Other Ambulatory Visit: Payer: Self-pay | Admitting: Pharmacist Clinician (PhC)/ Clinical Pharmacy Specialist

## 2013-06-17 MED ORDER — WARFARIN SODIUM 5 MG PO TABS: ORAL_TABLET | ORAL | Status: DC

## 2013-06-20 ENCOUNTER — Ambulatory Visit (INDEPENDENT_AMBULATORY_CARE_PROVIDER_SITE_OTHER): Payer: Medicare Other | Admitting: Pharmacist Clinician (PhC)/ Clinical Pharmacy Specialist

## 2013-06-20 ENCOUNTER — Other Ambulatory Visit: Payer: Self-pay

## 2013-06-20 DIAGNOSIS — Z7901 Long term (current) use of anticoagulants: Secondary | ICD-10-CM

## 2013-06-20 DIAGNOSIS — I4891 Unspecified atrial fibrillation: Secondary | ICD-10-CM

## 2013-06-20 LAB — PACEMAKER DEVICE OBSERVATION
BRDY-0002RV: 70 {beats}/min
BRDY-0004RV: 120 {beats}/min
RV LEAD AMPLITUDE: 15.68 mv
RV LEAD THRESHOLD: 0.75 V
VENTRICULAR PACING PM: 97.2

## 2013-06-20 LAB — POCT INR: INR: 3.1

## 2013-06-21 ENCOUNTER — Encounter: Payer: Self-pay | Admitting: Cardiovascular Disease

## 2013-07-12 ENCOUNTER — Ambulatory Visit (INDEPENDENT_AMBULATORY_CARE_PROVIDER_SITE_OTHER): Payer: Medicare Other | Admitting: Pharmacist Clinician (PhC)/ Clinical Pharmacy Specialist

## 2013-07-12 DIAGNOSIS — Z7901 Long term (current) use of anticoagulants: Secondary | ICD-10-CM

## 2013-07-12 DIAGNOSIS — I4891 Unspecified atrial fibrillation: Secondary | ICD-10-CM

## 2013-07-12 LAB — PACEMAKER DEVICE OBSERVATION

## 2013-08-12 ENCOUNTER — Ambulatory Visit (INDEPENDENT_AMBULATORY_CARE_PROVIDER_SITE_OTHER): Payer: Medicare Other | Admitting: Pharmacist Clinician (PhC)/ Clinical Pharmacy Specialist

## 2013-08-12 VITALS — BP 112/70 | HR 88

## 2013-08-12 DIAGNOSIS — Z7901 Long term (current) use of anticoagulants: Secondary | ICD-10-CM

## 2013-08-12 DIAGNOSIS — I4891 Unspecified atrial fibrillation: Secondary | ICD-10-CM

## 2013-09-09 ENCOUNTER — Ambulatory Visit: Payer: Medicare Other | Admitting: Pharmacist Clinician (PhC)/ Clinical Pharmacy Specialist

## 2013-09-13 ENCOUNTER — Ambulatory Visit (INDEPENDENT_AMBULATORY_CARE_PROVIDER_SITE_OTHER): Payer: Medicare Other | Admitting: Pharmacist Clinician (PhC)/ Clinical Pharmacy Specialist

## 2013-09-13 VITALS — BP 160/88 | HR 76

## 2013-09-13 DIAGNOSIS — Z7901 Long term (current) use of anticoagulants: Secondary | ICD-10-CM

## 2013-09-13 DIAGNOSIS — I4891 Unspecified atrial fibrillation: Secondary | ICD-10-CM

## 2013-09-30 ENCOUNTER — Ambulatory Visit: Payer: Medicare Other | Admitting: Pharmacist Clinician (PhC)/ Clinical Pharmacy Specialist

## 2013-10-03 ENCOUNTER — Ambulatory Visit (INDEPENDENT_AMBULATORY_CARE_PROVIDER_SITE_OTHER): Payer: Medicare Other | Admitting: Pharmacist Clinician (PhC)/ Clinical Pharmacy Specialist

## 2013-10-03 VITALS — BP 120/76 | HR 84

## 2013-10-03 DIAGNOSIS — I4891 Unspecified atrial fibrillation: Secondary | ICD-10-CM

## 2013-10-03 DIAGNOSIS — Z7901 Long term (current) use of anticoagulants: Secondary | ICD-10-CM

## 2013-10-03 LAB — POCT INR: INR: 1.9

## 2013-10-30 ENCOUNTER — Ambulatory Visit: Payer: Medicare Other | Admitting: Pharmacist Clinician (PhC)/ Clinical Pharmacy Specialist

## 2013-11-01 ENCOUNTER — Telehealth: Payer: Self-pay | Admitting: Cardiovascular Disease

## 2013-11-01 MED ORDER — ALLOPURINOL 100 MG PO TABS: 100.0000 mg | ORAL_TABLET | Freq: Every day | ORAL | Status: DC

## 2013-11-01 NOTE — Telephone Encounter (Signed)
Pt is completely out of her Allopurinal .Please call it in to [facility] -[phone] She have been trying to get this since last week.

## 2013-11-01 NOTE — Telephone Encounter (Signed)
Completely out of her All

## 2013-11-01 NOTE — Telephone Encounter (Signed)
Rx for #30 w/ 0RF. Future refills must come from patient's PCP.

## 2013-11-06 ENCOUNTER — Ambulatory Visit (INDEPENDENT_AMBULATORY_CARE_PROVIDER_SITE_OTHER): Payer: Medicare Other | Admitting: Pharmacist Clinician (PhC)/ Clinical Pharmacy Specialist

## 2013-11-06 VITALS — BP 120/80 | HR 84

## 2013-11-06 DIAGNOSIS — I4891 Unspecified atrial fibrillation: Secondary | ICD-10-CM

## 2013-11-06 DIAGNOSIS — Z7901 Long term (current) use of anticoagulants: Secondary | ICD-10-CM

## 2013-11-06 MED ORDER — ALLOPURINOL 100 MG PO TABS: 100.0000 mg | ORAL_TABLET | Freq: Every day | ORAL | Status: AC

## 2013-11-06 MED ORDER — SIMVASTATIN 40 MG PO TABS: 40.0000 mg | ORAL_TABLET | Freq: Every evening | ORAL | Status: DC

## 2013-11-06 MED ORDER — TORSEMIDE 20 MG PO TABS: 20.0000 mg | ORAL_TABLET | Freq: Every day | ORAL | Status: DC

## 2013-12-04 ENCOUNTER — Ambulatory Visit: Payer: Medicare Other | Admitting: Pharmacist Clinician (PhC)/ Clinical Pharmacy Specialist

## 2013-12-05 ENCOUNTER — Ambulatory Visit: Payer: Medicare Other | Admitting: Pharmacist Clinician (PhC)/ Clinical Pharmacy Specialist

## 2013-12-05 ENCOUNTER — Encounter: Payer: Medicare Other | Admitting: Cardiovascular Disease

## 2014-01-02 ENCOUNTER — Encounter: Payer: Medicare Other | Admitting: Cardiovascular Disease

## 2014-01-02 ENCOUNTER — Ambulatory Visit: Payer: Medicare Other | Admitting: Pharmacist Clinician (PhC)/ Clinical Pharmacy Specialist

## 2014-01-10 ENCOUNTER — Other Ambulatory Visit: Payer: Self-pay | Admitting: *Deleted

## 2014-01-10 MED ORDER — METOPROLOL TARTRATE 50 MG PO TABS: 50.0000 mg | ORAL_TABLET | Freq: Two times a day (BID) | ORAL | Status: DC

## 2014-01-17 ENCOUNTER — Ambulatory Visit (INDEPENDENT_AMBULATORY_CARE_PROVIDER_SITE_OTHER): Payer: Medicare Other | Admitting: Pharmacist Clinician (PhC)/ Clinical Pharmacy Specialist

## 2014-01-17 VITALS — BP 132/86 | HR 92

## 2014-01-17 DIAGNOSIS — I4891 Unspecified atrial fibrillation: Secondary | ICD-10-CM

## 2014-01-17 DIAGNOSIS — Z7901 Long term (current) use of anticoagulants: Secondary | ICD-10-CM

## 2014-01-17 LAB — POCT INR: INR: 2

## 2014-01-30 ENCOUNTER — Encounter: Payer: Self-pay | Admitting: Cardiovascular Disease

## 2014-02-03 ENCOUNTER — Encounter: Payer: Medicare Other | Admitting: Cardiovascular Disease

## 2014-02-14 ENCOUNTER — Ambulatory Visit: Payer: Medicare Other | Admitting: Pharmacist Clinician (PhC)/ Clinical Pharmacy Specialist

## 2014-02-26 ENCOUNTER — Ambulatory Visit (INDEPENDENT_AMBULATORY_CARE_PROVIDER_SITE_OTHER): Payer: Medicare Other | Admitting: Pharmacist Clinician (PhC)/ Clinical Pharmacy Specialist

## 2014-02-26 DIAGNOSIS — Z7901 Long term (current) use of anticoagulants: Secondary | ICD-10-CM

## 2014-02-26 DIAGNOSIS — I4891 Unspecified atrial fibrillation: Secondary | ICD-10-CM

## 2014-02-26 LAB — POCT INR: INR: 3.6

## 2014-03-19 ENCOUNTER — Ambulatory Visit (INDEPENDENT_AMBULATORY_CARE_PROVIDER_SITE_OTHER): Payer: Medicare Other | Admitting: Pharmacist Clinician (PhC)/ Clinical Pharmacy Specialist

## 2014-03-19 DIAGNOSIS — Z7901 Long term (current) use of anticoagulants: Secondary | ICD-10-CM

## 2014-03-19 DIAGNOSIS — I4891 Unspecified atrial fibrillation: Secondary | ICD-10-CM

## 2014-03-19 LAB — POCT INR: INR: 4.2

## 2014-04-02 ENCOUNTER — Ambulatory Visit: Payer: Medicare Other | Admitting: Pharmacist Clinician (PhC)/ Clinical Pharmacy Specialist

## 2014-04-04 ENCOUNTER — Ambulatory Visit (INDEPENDENT_AMBULATORY_CARE_PROVIDER_SITE_OTHER): Payer: Medicare Other | Admitting: Pharmacist Clinician (PhC)/ Clinical Pharmacy Specialist

## 2014-04-04 DIAGNOSIS — Z7901 Long term (current) use of anticoagulants: Secondary | ICD-10-CM

## 2014-04-04 DIAGNOSIS — I4891 Unspecified atrial fibrillation: Secondary | ICD-10-CM

## 2014-04-04 LAB — POCT INR: INR: 1.8

## 2014-04-25 ENCOUNTER — Ambulatory Visit: Payer: Medicare Other | Admitting: Pharmacist Clinician (PhC)/ Clinical Pharmacy Specialist

## 2014-04-26 ENCOUNTER — Other Ambulatory Visit: Payer: Self-pay | Admitting: Cardiovascular Disease

## 2014-04-28 ENCOUNTER — Encounter: Payer: Self-pay | Admitting: Cardiology

## 2014-04-28 NOTE — Telephone Encounter (Signed)
Rx refill sent to patient pharmacy   

## 2014-04-30 ENCOUNTER — Other Ambulatory Visit: Payer: Self-pay

## 2014-04-30 MED ORDER — SIMVASTATIN 40 MG PO TABS: ORAL_TABLET | ORAL | Status: DC

## 2014-04-30 NOTE — Telephone Encounter (Signed)
Rx was sent to pharmacy electronically. 

## 2014-05-02 ENCOUNTER — Ambulatory Visit (INDEPENDENT_AMBULATORY_CARE_PROVIDER_SITE_OTHER): Payer: Medicare Other | Admitting: Pharmacist Clinician (PhC)/ Clinical Pharmacy Specialist

## 2014-05-02 DIAGNOSIS — I4891 Unspecified atrial fibrillation: Secondary | ICD-10-CM

## 2014-05-02 DIAGNOSIS — Z7901 Long term (current) use of anticoagulants: Secondary | ICD-10-CM

## 2014-05-02 LAB — POCT INR: INR: 2.2

## 2014-05-09 ENCOUNTER — Telehealth: Payer: Self-pay | Admitting: Cardiovascular Disease

## 2014-05-09 NOTE — Telephone Encounter (Signed)
05-09-14 CERTIFIED LETTER SIGNED BY S Arras  05-03-14/MT

## 2014-05-26 ENCOUNTER — Other Ambulatory Visit: Payer: Self-pay | Admitting: Cardiovascular Disease

## 2014-05-26 NOTE — Telephone Encounter (Signed)
Rx refill sent to patient pharmacy   

## 2014-05-28 ENCOUNTER — Ambulatory Visit: Payer: Medicare Other | Admitting: Pharmacist Clinician (PhC)/ Clinical Pharmacy Specialist

## 2014-06-03 ENCOUNTER — Telehealth: Payer: Self-pay | Admitting: Cardiovascular Disease

## 2014-06-03 NOTE — Telephone Encounter (Signed)
Closed encounter °

## 2014-06-18 ENCOUNTER — Ambulatory Visit: Payer: Medicare Other | Admitting: Pharmacist Clinician (PhC)/ Clinical Pharmacy Specialist

## 2014-06-19 ENCOUNTER — Other Ambulatory Visit: Payer: Self-pay | Admitting: Pharmacist Clinician (PhC)/ Clinical Pharmacy Specialist

## 2014-06-19 MED ORDER — WARFARIN SODIUM 5 MG PO TABS: ORAL_TABLET | ORAL | Status: DC

## 2014-06-20 ENCOUNTER — Ambulatory Visit (INDEPENDENT_AMBULATORY_CARE_PROVIDER_SITE_OTHER): Payer: Medicare Other | Admitting: Cardiovascular Disease

## 2014-06-20 ENCOUNTER — Encounter: Payer: Self-pay | Admitting: Cardiovascular Disease

## 2014-06-20 ENCOUNTER — Ambulatory Visit (INDEPENDENT_AMBULATORY_CARE_PROVIDER_SITE_OTHER): Payer: Medicare Other | Admitting: Pharmacist Clinician (PhC)/ Clinical Pharmacy Specialist

## 2014-06-20 VITALS — BP 159/86 | HR 76 | Resp 16 | Ht 63.0 in | Wt 194.5 lb

## 2014-06-20 DIAGNOSIS — I4891 Unspecified atrial fibrillation: Secondary | ICD-10-CM

## 2014-06-20 DIAGNOSIS — I482 Chronic atrial fibrillation, unspecified: Secondary | ICD-10-CM

## 2014-06-20 DIAGNOSIS — I10 Essential (primary) hypertension: Secondary | ICD-10-CM

## 2014-06-20 DIAGNOSIS — Z45010 Encounter for checking and testing of cardiac pacemaker pulse generator [battery]: Secondary | ICD-10-CM

## 2014-06-20 DIAGNOSIS — Z45018 Encounter for adjustment and management of other part of cardiac pacemaker: Secondary | ICD-10-CM

## 2014-06-20 DIAGNOSIS — Z7901 Long term (current) use of anticoagulants: Secondary | ICD-10-CM

## 2014-06-20 DIAGNOSIS — E782 Mixed hyperlipidemia: Secondary | ICD-10-CM

## 2014-06-20 LAB — POCT INR: INR: 1.8

## 2014-06-20 NOTE — Patient Instructions (Signed)
Your physician recommends that you return for lab work in: FASTING.  Your physician recommends that you schedule a follow-up appointment in 3 months for remote pacer check and 6 months with Dr. Royann Shiversroitoru.

## 2014-06-22 ENCOUNTER — Encounter: Payer: Self-pay | Admitting: Cardiovascular Disease

## 2014-06-22 DIAGNOSIS — E782 Mixed hyperlipidemia: Secondary | ICD-10-CM | POA: Insufficient documentation

## 2014-06-22 DIAGNOSIS — I10 Essential (primary) hypertension: Secondary | ICD-10-CM | POA: Insufficient documentation

## 2014-06-22 NOTE — Assessment & Plan Note (Signed)
Situational elevation in blood pressure today. No changes made.

## 2014-06-22 NOTE — Assessment & Plan Note (Signed)
Normal device function. No permanent reprogramming is needed today. We'll alternate remote pacemaker checks with office visits.

## 2014-06-22 NOTE — Progress Notes (Signed)
Patient ID: Jacqueline Mendoza, female   DOB: 1933/06/13, 80 y.o.   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 She has treated systemic hypertension and a history of mixed hyperlipidemia and gout. She is on long-standing warfarin anticoagulation without problems. By echocardiography shows normal left ventricular systolic function, left ventricular hypertrophy and a moderate to severely dilated left atrium. She does not have significant valvular disease.  She has no physical complaints but is very upset since she lost her last sibling just 2 days ago. She came from an initial group of 8 and is the sole survivor and her family. Her blood pressure is a little high today.  Pacemaker shows normal function with roughly 50% ventricular pacing and 50% ventricular sensing. She has had very brief episodes of high ventricular rate. Lead parameters are excellent. Battery longevity is estimated to be over 14 years.  Allergies  Allergen Reactions  . Quinine Derivatives Anaphylaxis    Current Outpatient Prescriptions  Medication Sig Dispense Refill  . allopurinol (ZYLOPRIM) 100 MG tablet Take 1 tablet (100 mg total) by mouth daily.  30 tablet  3  . aspirin 81 MG tablet Take 81 mg by mouth daily.      . Cholecalciferol (VITAMIN D) 2000 UNITS tablet Take 2,000 Units by mouth 2 (two) times daily.      Marland Kitchen co-enzyme Q-10 30 MG capsule Take 30 mg by mouth daily.      . colchicine 0.6 MG tablet Take 0.6 mg by mouth daily.      Marland Kitchen darifenacin (ENABLEX) 15 MG 24 hr tablet Take 15 mg by mouth daily.      . metoprolol (LOPRESSOR) 50 MG tablet Take 1 tablet (50 mg total) by mouth 2 (two) times daily.  60 tablet  6  . Multiple Vitamin  (MULTIVITAMIN WITH MINERALS) TABS Take 1 tablet by mouth daily.      Marland Kitchen omega-3 acid ethyl esters (LOVAZA) 1 G capsule Take 1 g by mouth daily.      Marland Kitchen omeprazole (PRILOSEC OTC) 20 MG tablet Take 20 mg by mouth 2 (two) times daily.      . potassium chloride SA (K-DUR,KLOR-CON) 20 MEQ tablet Take 20 mEq by mouth daily.      . simethicone (MYLICON) 125 MG chewable tablet Chew 125 mg by mouth daily.      . simvastatin (ZOCOR) 40 MG tablet Take one tablet by mouth daily in the evening  90 tablet  1  . torsemide (DEMADEX) 20 MG tablet TAKE ONE TABLET BY MOUTH ONE TIME DAILY   30 tablet  4  . warfarin (COUMADIN) 5 MG tablet Take 1/2-1 tablet by mouth daily as directed  30 tablet  3   No current facility-administered medications for this visit.    Past Medical History  Diagnosis Date  . SSS (sick sinus syndrome)   . PAF (paroxysmal atrial fibrillation)   . Venous insufficiency     Chronic, mild    Past Surgical History  Procedure Laterality Date  . Cardiac catheterization  09/21/1998    Mild 2 vessel disease (50% lesion of proximal left circumflex and 100% occlusion of PDA-not felt to be amenable to intervention). Recommend medical therapy.  . Cardiovascular  stress test  07/26/2002    Normal study. EF 81%\  . Cardiac catheterization  01/29/2007    Normal coronaries. 100% PDA occlusion. Recommend permanent pacemaker for tachy-brady syndrome.  . Pacemaker insertion  01/30/2007    Medtronic EnRhythm, model [id number], serial [id number]  . Venous duplex  06/14/2011    No evidence of thrombus or thrombophlebitis  . Transthoracic echocardiogram  11/14/2012    EF 55-65%, mild-moderate mitral valve regurg, rt atrium moderatelty dilated  . Pacemaker generator change  05/17/2013    Medtronic AdaptaL, model #ADDRL1, serial [id number]    No family history on file.  History   Social History  . Marital Status: Single    Spouse Name: N/A    Number of Children: N/A  . Years of Education: N/A    Occupational History  . Not on file.   Social History Main Topics  . Smoking status: Never Smoker   . Smokeless tobacco: Never Used  . Alcohol Use: Yes     Comment: occas.  . Drug Use: Not on file  . Sexual Activity: Not on file   Other Topics Concern  . Not on file   Social History Narrative  . No narrative on file    Review of systems: The patient specifically denies any chest pain at rest or with exertion, dyspnea at rest or with exertion, orthopnea, paroxysmal nocturnal dyspnea, syncope, palpitations, focal neurological deficits, intermittent claudication, lower extremity edema, unexplained weight gain, cough, hemoptysis or wheezing.  The patient also denies abdominal pain, nausea, vomiting, dysphagia, diarrhea, constipation, polyuria, polydipsia, dysuria, hematuria, frequency, urgency, abnormal bleeding or bruising, fever, chills, unexpected weight changes, mood swings, change in skin or hair texture, change in voice quality, auditory or visual problems, allergic reactions or rashes, new musculoskeletal complaints other than usual "aches and pains".   PHYSICAL EXAM BP 159/86  Pulse 76  Resp 16  Ht 5\' 3"  (1.6 m)  Wt 194 lb 8 oz (88.225 kg)  BMI 34.46 kg/m2  General: Alert, oriented x3, no distress Head: no evidence of trauma, PERRL, EOMI, no exophtalmos or lid lag, no myxedema, no xanthelasma; normal ears, nose and oropharynx Neck: normal jugular venous pulsations and no hepatojugular reflux; brisk carotid pulses without delay and no carotid bruits Chest: clear to auscultation, no signs of consolidation by percussion or palpation, normal fremitus, symmetrical and full respiratory excursions, healthy subclavian pacemaker site Cardiovascular: normal position and quality of the apical impulse, regular rhythm, normal first and second heart sounds, no murmurs, rubs or gallops Abdomen: no tenderness or distention, no masses by palpation, no abnormal pulsatility or arterial  bruits, normal bowel sounds, no hepatosplenomegaly Extremities: no clubbing, cyanosis or edema; 2+ radial, ulnar and brachial pulses bilaterally; 2+ right femoral, posterior tibial and dorsalis pedis pulses; 2+ left femoral, posterior tibial and dorsalis pedis pulses; no subclavian or femoral bruits Neurological: grossly nonfocal   EKG: Atrial fibrillation with intermittent ventricular pacing, since the beats showed anterolateral T-wave inversion which is a chronic finding  BMET    Component Value Date/Time   NA 141 05/17/2013 1130   K 3.9 05/17/2013 1130   CL 105 05/17/2013 1130   CO2 28 05/17/2013 1130   GLUCOSE 105* 05/17/2013 1130   BUN 26* 05/17/2013 1130   CREATININE 1.70* 05/17/2013 1130   CALCIUM 10.0 05/17/2013 1130   GFRNONAA 28* 05/17/2013 1130   GFRAA 32* 05/17/2013 1130     ASSESSMENT AND PLAN Pacemaker - Medtronic (dual ch. programmed VVIR) Normal device function. No permanent reprogramming  is needed today. We'll alternate remote pacemaker checks with office visits.  Atrial fibrillation Appropriately anticoagulated. No bleeding problems there  HTN (hypertension) Situational elevation in blood pressure today. No changes made.  Mixed hyperlipidemia Plan to recheck her labs.   Orders Placed This Encounter  Procedures  . CBC  . Comprehensive metabolic panel  . TSH  . Lipid panel  . EKG 12-Lead   Meds ordered this encounter  Medications  . Cholecalciferol (VITAMIN D) 2000 UNITS tablet    Sig: Take 2,000 Units by mouth 2 (two) times daily.    Junious Silk, MD, [facility] [phone] office [phone] pager

## 2014-06-22 NOTE — Assessment & Plan Note (Signed)
Appropriately anticoagulated. No bleeding problems there

## 2014-06-22 NOTE — Assessment & Plan Note (Signed)
Plan to recheck her labs.

## 2014-06-23 LAB — MDC_IDC_ENUM_SESS_TYPE_INCLINIC
Battery Remaining Longevity: 14.5
Brady Statistic RV Percent Paced: 52 %
Lead Channel Pacing Threshold Amplitude: 0.75 V
Lead Channel Pacing Threshold Pulse Width: 0.4 ms
Lead Channel Setting Pacing Amplitude: 2.25 V
MDC IDC MSMT BATTERY VOLTAGE: 2.78 V
MDC IDC MSMT LEADCHNL RV IMPEDANCE VALUE: 538 Ohm
MDC IDC MSMT LEADCHNL RV SENSING INTR AMPL: 15.68 mV
MDC IDC SET LEADCHNL RV PACING PULSEWIDTH: 0.4 ms
MDC IDC SET LEADCHNL RV SENSING SENSITIVITY: 4 mV

## 2014-07-03 ENCOUNTER — Encounter: Payer: Self-pay | Admitting: Cardiovascular Disease

## 2014-07-05 ENCOUNTER — Emergency Department (HOSPITAL_BASED_OUTPATIENT_CLINIC_OR_DEPARTMENT_OTHER): Payer: Medicare Other

## 2014-07-05 ENCOUNTER — Emergency Department (HOSPITAL_BASED_OUTPATIENT_CLINIC_OR_DEPARTMENT_OTHER)
Admission: EM | Admit: 2014-07-05 | Discharge: 2014-07-06 | Disposition: A | Discharge status: Home / Self Care | Payer: Medicare Other | Attending: Emergency Medicine | Admitting: Emergency Medicine

## 2014-07-05 ENCOUNTER — Encounter (HOSPITAL_BASED_OUTPATIENT_CLINIC_OR_DEPARTMENT_OTHER): Payer: Self-pay | Admitting: Emergency Medicine

## 2014-07-05 DIAGNOSIS — Y9389 Activity, other specified: Secondary | ICD-10-CM | POA: Diagnosis not present

## 2014-07-05 DIAGNOSIS — S52609A Unspecified fracture of lower end of unspecified ulna, initial encounter for closed fracture: Principal | ICD-10-CM

## 2014-07-05 DIAGNOSIS — Z950 Presence of cardiac pacemaker: Secondary | ICD-10-CM | POA: Diagnosis not present

## 2014-07-05 DIAGNOSIS — Z79899 Other long term (current) drug therapy: Secondary | ICD-10-CM | POA: Diagnosis not present

## 2014-07-05 DIAGNOSIS — I495 Sick sinus syndrome: Secondary | ICD-10-CM | POA: Insufficient documentation

## 2014-07-05 DIAGNOSIS — Y929 Unspecified place or not applicable: Secondary | ICD-10-CM | POA: Insufficient documentation

## 2014-07-05 DIAGNOSIS — Z7901 Long term (current) use of anticoagulants: Secondary | ICD-10-CM | POA: Diagnosis not present

## 2014-07-05 DIAGNOSIS — S62102A Fracture of unspecified carpal bone, left wrist, initial encounter for closed fracture: Secondary | ICD-10-CM

## 2014-07-05 DIAGNOSIS — Z9889 Other specified postprocedural states: Secondary | ICD-10-CM | POA: Insufficient documentation

## 2014-07-05 DIAGNOSIS — R296 Repeated falls: Secondary | ICD-10-CM | POA: Diagnosis not present

## 2014-07-05 DIAGNOSIS — I4891 Unspecified atrial fibrillation: Secondary | ICD-10-CM | POA: Diagnosis not present

## 2014-07-05 DIAGNOSIS — S6990XA Unspecified injury of unspecified wrist, hand and finger(s), initial encounter: Secondary | ICD-10-CM | POA: Diagnosis present

## 2014-07-05 DIAGNOSIS — I872 Venous insufficiency (chronic) (peripheral): Secondary | ICD-10-CM | POA: Insufficient documentation

## 2014-07-05 DIAGNOSIS — Z7982 Long term (current) use of aspirin: Secondary | ICD-10-CM | POA: Diagnosis not present

## 2014-07-05 DIAGNOSIS — S52509A Unspecified fracture of the lower end of unspecified radius, initial encounter for closed fracture: Secondary | ICD-10-CM | POA: Diagnosis not present

## 2014-07-05 MED ORDER — HYDROCODONE-ACETAMINOPHEN 5-325 MG PO TABS: 1.0000 | ORAL_TABLET | Freq: Four times a day (QID) | ORAL | Status: DC | PRN

## 2014-07-05 MED ORDER — HYDROCODONE-ACETAMINOPHEN 5-325 MG PO TABS
1.0000 | ORAL_TABLET | Freq: Once | ORAL | Status: AC
  Administered 2014-07-05: 1 via ORAL
  Filled 2014-07-05: qty 1

## 2014-07-05 NOTE — ED Provider Notes (Signed)
CSN: [id number]     Arrival date & time 07/05/14  2052 History  This chart was scribed for Hanley Seamen, MD by Elon Spanner, ED Scribe. This patient was seen in room MH05/MH05 and the patient's care was started at 11:31 PM.    Chief Complaint  Patient presents with  . Wrist Injury    The history is provided by the patient and a relative. No language interpreter was used.   HPI Comments: Jacqueline Mendoza is a 80 y.o. female who presents to the Emergency Department complaining of a wrist injury sustained approximately 3 hours ago.  Patient's son states that she was hitting a pinata at a child's party causing injury to her left wrist but was delayed in noticing the pain. When she noticed the pain, she was taken aback and fell backward and landed on her bottom but suffered no additional injury.  She currently reports no pain in any area except her left wrist, which she rates about 6/10, worse with palpation or movement. She has been using an ice pack to help ease pain and swelling.   Past Medical History  Diagnosis Date  . SSS (sick sinus syndrome)   . PAF (paroxysmal atrial fibrillation)   . Venous insufficiency     Chronic, mild   Past Surgical History  Procedure Laterality Date  . Cardiac catheterization  09/21/1998    Mild 2 vessel disease (50% lesion of proximal left circumflex and 100% occlusion of PDA-not felt to be amenable to intervention). Recommend medical therapy.  . Cardiovascular stress test  07/26/2002    Normal study. EF 81%\  . Cardiac catheterization  01/29/2007    Normal coronaries. 100% PDA occlusion. Recommend permanent pacemaker for tachy-brady syndrome.  . Pacemaker insertion  01/30/2007    Medtronic EnRhythm, model D1939726, serial [id number]  . Venous duplex  06/14/2011    No evidence of thrombus or thrombophlebitis  . Transthoracic echocardiogram  11/14/2012    EF 55-65%, mild-moderate mitral valve regurg, rt atrium moderatelty dilated  . Pacemaker generator change   05/17/2013    Medtronic AdaptaL, model #ADDRL1, serial [id number]   History reviewed. No pertinent family history. History  Substance Use Topics  . Smoking status: Never Smoker   . Smokeless tobacco: Never Used  . Alcohol Use: Yes     Comment: occas.   OB History   Grav Para Term Preterm Abortions TAB SAB Ect Mult Living                 Review of Systems  A complete 10 system review of systems was obtained and all systems are negative except as noted in the HPI and PMH.   Allergies  Quinine derivatives  Home Medications   Prior to Admission medications   Medication Sig Start Date End Date Taking? Authorizing Provider  allopurinol (ZYLOPRIM) 100 MG tablet Take 1 tablet (100 mg total) by mouth daily. 11/06/13   Mihai Croitoru, MD  aspirin 81 MG tablet Take 81 mg by mouth daily.    Historical Provider, MD  Cholecalciferol (VITAMIN D) 2000 UNITS tablet Take 2,000 Units by mouth 2 (two) times daily.    Historical Provider, MD  co-enzyme Q-10 30 MG capsule Take 30 mg by mouth daily.    Historical Provider, MD  colchicine 0.6 MG tablet Take 0.6 mg by mouth daily.    Historical Provider, MD  darifenacin (ENABLEX) 15 MG 24 hr tablet Take 15 mg by mouth daily.    Historical Provider, MD  HYDROcodone-acetaminophen (NORCO/VICODIN) 5-325 MG per tablet Take 1-2 tablets by mouth every 6 (six) hours as needed. 2014-07-24   Carlisle Beers Carrick Rijos, MD  metoprolol (LOPRESSOR) 50 MG tablet Take 1 tablet (50 mg total) by mouth 2 (two) times daily. 01/10/14   Mihai Croitoru, MD  Multiple Vitamin (MULTIVITAMIN WITH MINERALS) TABS Take 1 tablet by mouth daily.    Historical Provider, MD  omega-3 acid ethyl esters (LOVAZA) 1 G capsule Take 1 g by mouth daily.    Historical Provider, MD  omeprazole (PRILOSEC OTC) 20 MG tablet Take 20 mg by mouth 2 (two) times daily.    Historical Provider, MD  potassium chloride SA (K-DUR,KLOR-CON) 20 MEQ tablet Take 20 mEq by mouth daily.    Historical Provider, MD  simethicone  (MYLICON) 125 MG chewable tablet Chew 125 mg by mouth daily.    Historical Provider, MD  simvastatin (ZOCOR) 40 MG tablet Take one tablet by mouth daily in the evening 04/30/14   Thurmon Fair, MD  torsemide (DEMADEX) 20 MG tablet TAKE ONE TABLET BY MOUTH ONE TIME DAILY  05/26/14   Thurmon Fair, MD  warfarin (COUMADIN) 5 MG tablet Take 1/2-1 tablet by mouth daily as directed 06/19/14   Phillips Hay, RPH-CPP   BP 151/77  Pulse 72  Temp(Src) 98 F (36.7 C)  Resp 18  Ht 5\' 3"  (1.6 m)  Wt 190 lb (86.183 kg)  BMI 33.67 kg/m2  SpO2 97% Physical Exam  General: Well-developed, well-nourished female in no acute distress; appearance consistent with age of record HENT: normocephalic; atraumatic Eyes: pupils equal, round and reactive to light; extraocular muscles intact; lens implants; left ptosis. Neck: supple; no c-spine tenderness Heart: regular rate and rhythm; no murmurs, rubs or gallops Lungs: clear to auscultation bilaterally Chest: pacemaker in left upper chest Abdomen: soft; nondistended; nontender; no masses or hepatosplenomegaly; bowel sounds present Back: No spinal tenderness Extremities: No deformity; full range of motion; pulses normal; deformity, decreased ROM and tenderness in left wrist, tendon function intact distally, sensation intact distally, brisk capillary refill in all fingers distally; 1+ edema of lower legs.  Neurologic: Awake, alert and oriented; motor function intact in all extremities and symmetric; no facial droop Skin: Warm and dry Psychiatric: Normal mood and affect   ED Course  Procedures (including critical care time)  DIAGNOSTIC STUDIES: Oxygen Saturation is 97% on RA, normal by my interpretation.    COORDINATION OF CARE:  11:35 PM Discussed plan to refer to orthopaedic surgeon and splint wrist.  Patient acknowledges and agrees with plan.      MDM  Nursing notes and vitals signs, including pulse oximetry, reviewed.  Summary of this visit's  results, reviewed by myself:  Labs:  No results found for this or any previous visit (from the past 24 hour(s)).  Imaging Studies: Dg Wrist Complete Left  2014-07-24   CLINICAL DATA:  Left wrist trauma, pain  EXAM: LEFT WRIST - COMPLETE 3+ VIEW  COMPARISON:  None.  FINDINGS: There is a mildly displaced fracture of the ulnar styloid process. There is also a comminuted mildly displaced fracture involving the distal radial metaphysis. There is mild apex volar angulation. Dorsal fracture fragment is displaced dorsally by approximately 3 mm. Fracture extends into the wrist joint.  IMPRESSION: Fractures of the distal radius and ulna.   Electronically Signed   By: Esperanza Heir M.D.   On: 07-24-14 21:46     Final diagnoses:  Wrist fracture, closed, left, initial encounter   I personally performed the services  described in this documentation, which was scribed in my presence. The recorded information has been reviewed and is accurate.   Hanley SeamenJohn L Chrishawn Kring, MD 07/05/14 [phone]

## 2014-07-05 NOTE — ED Notes (Signed)
Pt with left wrist deformity after striking the pinata at a party

## 2014-07-09 ENCOUNTER — Telehealth: Payer: Self-pay | Admitting: Cardiovascular Disease

## 2014-07-09 NOTE — Telephone Encounter (Signed)
Returned call to patient's daughter Jacqueline DuckSheryl spoke to Dr.Croitoru he advised ok for mother to hold coumadin 5 days prior to surgery.

## 2014-07-09 NOTE — Telephone Encounter (Signed)
New Prob    States pt broke her wrist on Saturday 8/8 and will undergo surgery within the coming days. Daughter is requesting instructions regarding pts Coumadin and how to move forward. Pt is under the care of [facility]. Please call.

## 2014-07-11 ENCOUNTER — Telehealth: Payer: Self-pay | Admitting: *Deleted

## 2014-07-11 ENCOUNTER — Telehealth: Payer: Self-pay | Admitting: Cardiovascular Disease

## 2014-07-11 NOTE — Telephone Encounter (Signed)
Pt's daughter called stating that she was returning Jacqueline Mendoza's phone call. Please call  thanks

## 2014-07-11 NOTE — Telephone Encounter (Signed)
Mrs. Jacqueline MooreJoshua's surgery is scheduled for Thursday.  Told to hold Coumadin & ASA 5-7 days prior to procedure.

## 2014-07-11 NOTE — Telephone Encounter (Signed)
Clearance to Hold Coumadin and ASA 5-7 days prior to left wrist fx repair and cleared with a low CV risk for the surgery faxed.

## 2014-07-16 ENCOUNTER — Ambulatory Visit: Payer: Medicare Other | Admitting: Pharmacist Clinician (PhC)/ Clinical Pharmacy Specialist

## 2014-07-28 ENCOUNTER — Ambulatory Visit: Payer: Medicare Other | Admitting: Pharmacist Clinician (PhC)/ Clinical Pharmacy Specialist

## 2014-07-30 ENCOUNTER — Telehealth: Payer: Self-pay | Admitting: Cardiovascular Disease

## 2014-07-30 ENCOUNTER — Ambulatory Visit: Payer: Medicare Other | Admitting: Pharmacist Clinician (PhC)/ Clinical Pharmacy Specialist

## 2014-07-30 NOTE — Telephone Encounter (Signed)
Close encounter 

## 2014-08-15 ENCOUNTER — Ambulatory Visit: Payer: Medicare Other | Admitting: Pharmacist Clinician (PhC)/ Clinical Pharmacy Specialist

## 2014-08-18 ENCOUNTER — Ambulatory Visit: Payer: Medicare Other | Admitting: Pharmacist Clinician (PhC)/ Clinical Pharmacy Specialist

## 2014-08-20 ENCOUNTER — Ambulatory Visit: Payer: Medicare Other | Admitting: Pharmacist Clinician (PhC)/ Clinical Pharmacy Specialist

## 2014-08-22 ENCOUNTER — Telehealth: Payer: Self-pay | Admitting: Pharmacist Clinician (PhC)/ Clinical Pharmacy Specialist

## 2014-08-22 NOTE — Telephone Encounter (Signed)
Dtr [facility] Wednesday, stating unable to get mother in for INR checks, due to dtr arthritis/inability to get around.  Wanted to know about possible HH.  Also, pt broke R arm at end of August, had surgical repair.  Dtr now worried about increased bruising.    Returned call - explained that HH not an option based on current situation.  Explained to dtr that we can be very flexible as to scheduling.  If dtr is having a good day and can therefore drive, she can call the office and get pt seen for INR on same day almost always.  Explained that on Tuesdays or Thursdays would need to ask for Triage RN to set best time.  Dtr voiced understanding.  For now will schedule pt for Monday at 2:30.

## 2014-08-25 ENCOUNTER — Ambulatory Visit (INDEPENDENT_AMBULATORY_CARE_PROVIDER_SITE_OTHER): Payer: Medicare Other | Admitting: Pharmacist Clinician (PhC)/ Clinical Pharmacy Specialist

## 2014-08-25 DIAGNOSIS — Z7901 Long term (current) use of anticoagulants: Secondary | ICD-10-CM

## 2014-08-25 DIAGNOSIS — I4891 Unspecified atrial fibrillation: Secondary | ICD-10-CM

## 2014-08-25 LAB — POCT INR: INR: 6.2

## 2014-09-01 ENCOUNTER — Ambulatory Visit: Payer: Medicare Other | Admitting: Pharmacist Clinician (PhC)/ Clinical Pharmacy Specialist

## 2014-09-04 ENCOUNTER — Other Ambulatory Visit: Payer: Self-pay | Admitting: Cardiovascular Disease

## 2014-09-04 NOTE — Telephone Encounter (Signed)
Rx was sent to pharmacy electronically. 

## 2014-09-05 ENCOUNTER — Ambulatory Visit: Payer: Medicare Other | Admitting: Pharmacist Clinician (PhC)/ Clinical Pharmacy Specialist

## 2014-09-08 ENCOUNTER — Ambulatory Visit: Payer: Medicare Other | Admitting: Pharmacist Clinician (PhC)/ Clinical Pharmacy Specialist

## 2014-09-09 ENCOUNTER — Ambulatory Visit: Payer: Medicare Other

## 2014-09-11 ENCOUNTER — Ambulatory Visit: Payer: Medicare Other

## 2014-09-12 ENCOUNTER — Other Ambulatory Visit: Payer: Self-pay

## 2014-09-12 ENCOUNTER — Telehealth: Payer: Self-pay | Admitting: Cardiovascular Disease

## 2014-09-12 NOTE — Telephone Encounter (Signed)
Closed encounter °

## 2014-09-15 ENCOUNTER — Ambulatory Visit: Payer: Medicare Other | Admitting: Pharmacist Clinician (PhC)/ Clinical Pharmacy Specialist

## 2014-09-17 ENCOUNTER — Ambulatory Visit (INDEPENDENT_AMBULATORY_CARE_PROVIDER_SITE_OTHER): Payer: Medicare Other | Admitting: Pharmacist Clinician (PhC)/ Clinical Pharmacy Specialist

## 2014-09-17 DIAGNOSIS — Z7901 Long term (current) use of anticoagulants: Secondary | ICD-10-CM

## 2014-09-17 DIAGNOSIS — I4891 Unspecified atrial fibrillation: Secondary | ICD-10-CM

## 2014-09-17 LAB — POCT INR: INR: 2.8

## 2014-09-22 ENCOUNTER — Encounter: Payer: Medicare Other | Admitting: *Deleted

## 2014-09-24 ENCOUNTER — Other Ambulatory Visit: Payer: Self-pay | Admitting: Cardiovascular Disease

## 2014-09-25 NOTE — Telephone Encounter (Signed)
Rx was sent to pharmacy electronically. 

## 2014-09-29 ENCOUNTER — Telehealth: Payer: Self-pay | Admitting: Cardiovascular Disease

## 2014-09-29 NOTE — Telephone Encounter (Signed)
Closed encounter °

## 2014-10-01 ENCOUNTER — Ambulatory Visit: Payer: Medicare Other | Admitting: Pharmacist Clinician (PhC)/ Clinical Pharmacy Specialist

## 2014-10-08 ENCOUNTER — Ambulatory Visit: Payer: Medicare Other | Admitting: Pharmacist Clinician (PhC)/ Clinical Pharmacy Specialist

## 2014-10-17 ENCOUNTER — Ambulatory Visit: Payer: Medicare Other | Admitting: Pharmacist Clinician (PhC)/ Clinical Pharmacy Specialist

## 2014-10-22 ENCOUNTER — Ambulatory Visit: Payer: Medicare Other | Admitting: Pharmacist Clinician (PhC)/ Clinical Pharmacy Specialist

## 2014-10-29 ENCOUNTER — Ambulatory Visit: Payer: Medicare Other | Admitting: Pharmacist Clinician (PhC)/ Clinical Pharmacy Specialist

## 2014-10-31 ENCOUNTER — Telehealth: Payer: Self-pay | Admitting: Cardiovascular Disease

## 2014-10-31 ENCOUNTER — Ambulatory Visit (INDEPENDENT_AMBULATORY_CARE_PROVIDER_SITE_OTHER): Payer: Medicare Other | Admitting: Pharmacist Clinician (PhC)/ Clinical Pharmacy Specialist

## 2014-10-31 DIAGNOSIS — I4891 Unspecified atrial fibrillation: Secondary | ICD-10-CM

## 2014-10-31 DIAGNOSIS — Z7901 Long term (current) use of anticoagulants: Secondary | ICD-10-CM

## 2014-10-31 LAB — POCT INR: INR: 2.8

## 2014-10-31 NOTE — Telephone Encounter (Signed)
Pt still have not received the box that she needs to check her pacemaker over the phone.

## 2014-10-31 NOTE — Telephone Encounter (Signed)
LMTCB//sss 

## 2014-11-03 ENCOUNTER — Other Ambulatory Visit: Payer: Self-pay | Admitting: Cardiovascular Disease

## 2014-11-03 NOTE — Telephone Encounter (Signed)
Rx has been sent to the pharmacy electronically. ° °

## 2014-11-06 ENCOUNTER — Encounter (HOSPITAL_COMMUNITY): Payer: Self-pay | Admitting: Cardiovascular Disease

## 2014-12-04 ENCOUNTER — Telehealth: Payer: Self-pay | Admitting: Cardiovascular Disease

## 2014-12-04 NOTE — Telephone Encounter (Signed)
Closed encounter °

## 2014-12-05 ENCOUNTER — Ambulatory Visit: Payer: Medicare Other | Admitting: Pharmacist Clinician (PhC)/ Clinical Pharmacy Specialist

## 2014-12-09 ENCOUNTER — Encounter: Payer: Self-pay | Admitting: *Deleted

## 2014-12-12 ENCOUNTER — Ambulatory Visit: Payer: Self-pay | Admitting: Pharmacist Clinician (PhC)/ Clinical Pharmacy Specialist

## 2014-12-19 ENCOUNTER — Ambulatory Visit: Payer: Self-pay | Admitting: Pharmacist Clinician (PhC)/ Clinical Pharmacy Specialist

## 2014-12-26 ENCOUNTER — Ambulatory Visit: Payer: Self-pay | Admitting: Pharmacist Clinician (PhC)/ Clinical Pharmacy Specialist

## 2014-12-30 ENCOUNTER — Telehealth: Payer: Self-pay | Admitting: Cardiovascular Disease

## 2014-12-30 NOTE — Telephone Encounter (Signed)
2/2 pt stated her daughter will call back to sched recall w/ dr. Renaye Rakers's office; ah

## 2015-01-27 ENCOUNTER — Other Ambulatory Visit: Payer: Self-pay

## 2015-01-29 MED ORDER — WARFARIN SODIUM 5 MG PO TABS: ORAL_TABLET | ORAL | Status: DC

## 2015-02-06 ENCOUNTER — Ambulatory Visit: Payer: Self-pay | Admitting: Pharmacist Clinician (PhC)/ Clinical Pharmacy Specialist

## 2015-02-09 ENCOUNTER — Ambulatory Visit: Payer: Self-pay | Admitting: Pharmacist Clinician (PhC)/ Clinical Pharmacy Specialist

## 2015-02-10 ENCOUNTER — Other Ambulatory Visit: Payer: Self-pay | Admitting: Cardiovascular Disease

## 2015-02-10 NOTE — Telephone Encounter (Signed)
Rx(s) sent to pharmacy electronically. OV 4/14 

## 2015-02-13 ENCOUNTER — Other Ambulatory Visit: Payer: Self-pay | Admitting: *Deleted

## 2015-02-13 MED ORDER — POTASSIUM CHLORIDE CRYS ER 20 MEQ PO TBCR: 20.0000 meq | EXTENDED_RELEASE_TABLET | Freq: Every day | ORAL | Status: DC

## 2015-02-13 NOTE — Telephone Encounter (Signed)
Rx(s) sent to pharmacy electronically.  

## 2015-03-12 ENCOUNTER — Ambulatory Visit: Payer: Self-pay | Admitting: Cardiovascular Disease

## 2015-03-12 ENCOUNTER — Other Ambulatory Visit: Payer: Self-pay | Admitting: Cardiovascular Disease

## 2015-05-01 ENCOUNTER — Ambulatory Visit (INDEPENDENT_AMBULATORY_CARE_PROVIDER_SITE_OTHER): Payer: Medicare Other | Admitting: Pharmacist Clinician (PhC)/ Clinical Pharmacy Specialist

## 2015-05-01 ENCOUNTER — Ambulatory Visit (INDEPENDENT_AMBULATORY_CARE_PROVIDER_SITE_OTHER): Payer: Medicare Other | Admitting: Cardiovascular Disease

## 2015-05-01 ENCOUNTER — Encounter: Payer: Self-pay | Admitting: Pharmacist Clinician (PhC)/ Clinical Pharmacy Specialist

## 2015-05-01 ENCOUNTER — Encounter: Payer: Self-pay | Admitting: Cardiovascular Disease

## 2015-05-01 VITALS — BP 118/70 | HR 74 | Resp 16 | Ht 63.0 in | Wt 164.4 lb

## 2015-05-01 DIAGNOSIS — I10 Essential (primary) hypertension: Secondary | ICD-10-CM | POA: Diagnosis not present

## 2015-05-01 DIAGNOSIS — I482 Chronic atrial fibrillation, unspecified: Secondary | ICD-10-CM

## 2015-05-01 DIAGNOSIS — R001 Bradycardia, unspecified: Secondary | ICD-10-CM

## 2015-05-01 DIAGNOSIS — Z45010 Encounter for checking and testing of cardiac pacemaker pulse generator [battery]: Secondary | ICD-10-CM | POA: Diagnosis not present

## 2015-05-01 DIAGNOSIS — Z7901 Long term (current) use of anticoagulants: Secondary | ICD-10-CM | POA: Diagnosis not present

## 2015-05-01 DIAGNOSIS — I4891 Unspecified atrial fibrillation: Secondary | ICD-10-CM

## 2015-05-01 LAB — POCT INR: INR: 4.3

## 2015-05-01 NOTE — Patient Instructions (Addendum)
Remote monitoring is used to monitor your Pacemaker of ICD from home. This monitoring reduces the number of office visits required to check your device to one time per year. It allows us to keep an eye on the functioning of your device to ensure it is working properly. You are scheduled for a device check from home on 07/31/2015. You may send your transmission at any time that day. If you have a wireless device, the transmission will be sent automatically. After your physician reviews your transmission, you will receive a postcard with your next transmission date.  Dr Royann Shiversroitoru recommends that you schedule a follow-up appointment in 6 months. You will receive a reminder letter in the mail two months in advance. If you don't receive a letter, please call our office to schedule the follow-up appointment.

## 2015-05-01 NOTE — Progress Notes (Signed)
This encounter was created in error - please disregard.

## 2015-05-01 NOTE — Progress Notes (Signed)
Patient ID: Jacqueline Mendoza, female   DOB: 1933/04/19, 81 y.o.   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 She has treated systemic hypertension and a history of mixed hyperlipidemia and gout. She is on long-standing warfarin anticoagulation without problems. By echocardiography has normal left ventricular systolic function, left ventricular hypertrophy and a moderate to severely dilated left atrium. She does not have significant valvular disease.  Jacqueline Mendoza feels well and has no cardiovascular complaints today. Not had any neurological events or bleeding complications on chronic warfarin therapy. INR is usually followed by Dr. Renae Gloss. The patient requested we check it today since she has had some transportation issues.  Her INR was supratherapeutic at 4.3 and appropriate adjustment was recommended.  Pacemaker interrogation shows normal device function. Her Medtronic Adapta DR generator was implanted in 2014, but is now programmed VVIR for what appears to be permanent atrial fibrillation. Generator longevity is estimated at 14 years. She has 45% ventricular pacing and very rare episodes of rapid ventricular response. Lead parameters are normal range.  Past Medical History  Diagnosis Date  . SSS (sick sinus syndrome)   . PAF (paroxysmal atrial fibrillation)   . Venous insufficiency     Chronic, mild    Past Surgical History  Procedure Laterality Date    . Cardiac catheterization  09/21/1998    Mild 2 vessel disease (50% lesion of proximal left circumflex and 100% occlusion of PDA-not felt to be amenable to intervention). Recommend medical therapy.  . Cardiovascular stress test  07/26/2002    Normal study. EF 81%\  . Cardiac catheterization  01/29/2007    Normal coronaries. 100% PDA occlusion. Recommend permanent pacemaker for tachy-brady syndrome.  . Pacemaker insertion  01/30/2007    Medtronic EnRhythm, model D1939726, serial [id number]  . Venous duplex  06/14/2011    No evidence of thrombus or thrombophlebitis  . Transthoracic echocardiogram  11/14/2012    EF 55-65%, mild-moderate mitral valve regurg, rt atrium moderatelty dilated  . Pacemaker generator change  05/17/2013    Medtronic AdaptaL, model #ADDRL1, serial [id number]  . Permanent pacemaker generator change N/A 05/17/2013    Procedure: PERMANENT PACEMAKER GENERATOR CHANGE;  Surgeon: Thurmon Fair, MD;  Location: [facility];  Service: Cardiovascular;  Laterality: N/A;     Current Outpatient Prescriptions  Medication Sig Dispense Refill  . allopurinol (ZYLOPRIM) 100 MG tablet Take 1 tablet (100 mg total) by mouth daily. 30 tablet 3  . aspirin 81 MG tablet Take 81 mg by mouth daily.    . Cholecalciferol (VITAMIN D) 2000 UNITS tablet Take 2,000 Units by mouth 2 (two) times daily.    Marland Kitchen co-enzyme Q-10 30 MG capsule Take 30 mg by mouth daily.    . colchicine 0.6 MG tablet Take 0.6 mg by mouth daily.    Marland Kitchen darifenacin (ENABLEX) 15 MG 24 hr tablet Take 15 mg by mouth daily.    . metoprolol (LOPRESSOR) 50 MG tablet TAKE ONE TABLET BY  MOUTH TWICE DAILY  60 tablet 9  . Multiple Vitamin (MULTIVITAMIN WITH MINERALS) TABS Take 1 tablet by mouth daily.    Marland Kitchen. omega-3 acid ethyl esters (LOVAZA) 1 G capsule Take 1 g by mouth daily.    . potassium chloride SA (K-DUR,KLOR-CON) 20 MEQ tablet Take 1 tablet (20 mEq total) by mouth daily. 30 tablet 4  . simvastatin (ZOCOR) 40 MG tablet take 1 tablet by  mouth daily in the evening 90 tablet 2  . torsemide (DEMADEX) 20 MG tablet TAKE ONE TABLET BY MOUTH ONE TIME DAILY 30 tablet 3  . warfarin (COUMADIN) 5 MG tablet Take 1 tablet by mouth daily or as directed.  NO REFILLS UNTIL INR CHECK 30 tablet 0   No current facility-administered medications for this visit.    Allergies:   Quinine derivatives    Social History:  The patient  reports that she has never smoked. She has never used smokeless tobacco. She reports that she drinks alcohol.    ROS:  Please see the history of present illness.    Otherwise, review of systems positive for none.   All other systems are reviewed and negative.    PHYSICAL EXAM: VS:  BP 118/70 mmHg  Pulse 74  Resp 16  Ht 5\' 3"  (1.6 m)  Wt 74.571 kg (164 lb 6.4 oz)  BMI 29.13 kg/m2 , BMI Body mass index is 29.13 kg/(m^2).  General: Alert, oriented x3, no distress Head: no evidence of trauma, PERRL, EOMI, no exophtalmos or lid lag, no myxedema, no xanthelasma; normal ears, nose and oropharynx Neck: normal jugular venous pulsations and no hepatojugular reflux; brisk carotid pulses without delay and no carotid bruits Chest: clear to auscultation, no signs of consolidation by percussion or palpation, normal fremitus, symmetrical and full respiratory excursions, healthy left subclavian pacemaker site Cardiovascular: normal position and quality of the apical impulse, irregular rhythm, normal first and second heart sounds, no murmurs, rubs or gallops Abdomen: no tenderness or distention, no masses by palpation, no abnormal pulsatility or arterial bruits, normal bowel sounds, no hepatosplenomegaly Extremities: no clubbing, cyanosis or edema; 2+ radial, ulnar and brachial pulses bilaterally; 2+ right femoral, posterior tibial and dorsalis pedis pulses; 2+ left femoral, posterior tibial and dorsalis pedis pulses; no subclavian or femoral bruits Neurological: grossly nonfocal Psych: euthymic mood, full affect   EKG:  EKG  is not ordered today.   Recent Labs: No results found for requested labs within last 365 days.    Lipid Panel No results found for: CHOL, TRIG, HDL, CHOLHDL, VLDL, LDLCALC, LDLDIRECT    Wt Readings from Last 3 Encounters:  05/01/15 74.571 kg (164 lb 6.4 oz)  07/05/14 86.183 kg (190 lb)  06/20/14 88.225 kg (194 lb 8 oz)      ASSESSMENT AND PLAN:  Pacemaker - Medtronic (dual ch. programmed VVIR) Normal device function. No permanent reprogramming is needed today. We'll alternate remote pacemaker checks with office visits.  Atrial fibrillation Supra therapeutically anticoagulated. No bleeding problems, however. Warfarin dose adjusted and further follow-up will be with Dr. Renae GlossShelton.  HTN (hypertension) Well-controlled No changes made.  Mixed hyperlipidemia Plan to recheck her labs.   Current medicines are reviewed at length with the patient today.  The patient does not have concerns regarding medicines.  The following changes have been made:    New Dose 17.5 mg 2.5 mg 2.5 mg 2.5 mg 2.5 mg 2.5 mg 2.5 mg 2.5 mg      (5 mg x 0.5)  (5 mg x  0.5)  (5 mg x 0.5)  (5 mg x 0.5)  (5 mg x 0.5)  (5 mg x 0.5)  (5 mg x 0.5)                               Description     No warfarin Friday June 3 or Saturday June 4, then take 1/2 tablet daily, repeat INR with internist in 10 days     Orders Placed This Encounter  Procedures  . EKG 12-Lead    Patient Instructions  Remote monitoring is used to monitor your Pacemaker of ICD from home. This monitoring reduces the number of office visits required to check your device to one time per year. It allows [place] to keep an eye on the functioning of your device to ensure it is working properly. You are scheduled for a device check from home on 07/31/2015. You may send your transmission at any time that day. If you have a wireless device, the transmission will be sent automatically. After your physician reviews your transmission, you will  receive a postcard with your next transmission date.  Dr Royann Shivers recommends that you schedule a follow-up appointment in 6 months. You will receive a reminder letter in the mail two months in advance. If you don't receive a letter, please call our office to schedule the follow-up appointment.    Joie Bimler, MD  05/03/2015 11:00 AM    Thurmon Fair, MD, [facility] [phone] office [phone] pager

## 2015-05-03 ENCOUNTER — Encounter: Payer: Self-pay | Admitting: Cardiovascular Disease

## 2015-05-04 LAB — CUP PACEART INCLINIC DEVICE CHECK
Battery Impedance: 132 Ohm
Battery Remaining Longevity: 166 mo
Battery Voltage: 2.79 V
Brady Statistic RV Percent Paced: 45 %
Lead Channel Impedance Value: 67 Ohm
Lead Channel Pacing Threshold Amplitude: 0.75 V
Lead Channel Pacing Threshold Pulse Width: 0.4 ms
Lead Channel Setting Pacing Pulse Width: 0.4 ms
Lead Channel Setting Sensing Sensitivity: 4 mV
MDC IDC MSMT LEADCHNL RV IMPEDANCE VALUE: 524 Ohm
MDC IDC MSMT LEADCHNL RV SENSING INTR AMPL: 11.2 mV
MDC IDC SESS DTM: 20160603184809
MDC IDC SET LEADCHNL RV PACING AMPLITUDE: 2 V

## 2015-05-12 ENCOUNTER — Encounter: Payer: Self-pay | Admitting: Cardiovascular Disease

## 2015-05-25 ENCOUNTER — Other Ambulatory Visit: Payer: Self-pay

## 2015-07-27 ENCOUNTER — Other Ambulatory Visit: Payer: Self-pay | Admitting: *Deleted

## 2015-07-27 MED ORDER — TORSEMIDE 20 MG PO TABS: 20.0000 mg | ORAL_TABLET | Freq: Every day | ORAL | Status: DC

## 2015-07-27 NOTE — Telephone Encounter (Signed)
Rx(s) sent to pharmacy electronically.  

## 2015-07-31 ENCOUNTER — Telehealth: Payer: Self-pay | Admitting: Cardiology

## 2015-07-31 ENCOUNTER — Encounter: Payer: Medicare Other | Admitting: *Deleted

## 2015-07-31 NOTE — Telephone Encounter (Signed)
Attempted to confirm remote transmission with pt. No answer and was unable to leave a message.   

## 2015-08-04 ENCOUNTER — Encounter: Payer: Self-pay | Admitting: Cardiology

## 2015-09-01 ENCOUNTER — Encounter: Payer: Self-pay | Admitting: *Deleted

## 2015-09-03 ENCOUNTER — Other Ambulatory Visit: Payer: Self-pay | Admitting: Cardiovascular Disease

## 2015-09-03 MED ORDER — POTASSIUM CHLORIDE CRYS ER 20 MEQ PO TBCR: 20.0000 meq | EXTENDED_RELEASE_TABLET | Freq: Every day | ORAL | Status: DC

## 2015-09-06 ENCOUNTER — Encounter: Payer: Self-pay | Admitting: Internal Medicine

## 2015-09-07 ENCOUNTER — Ambulatory Visit (INDEPENDENT_AMBULATORY_CARE_PROVIDER_SITE_OTHER): Payer: Medicare Other | Admitting: *Deleted

## 2015-09-07 DIAGNOSIS — I4891 Unspecified atrial fibrillation: Secondary | ICD-10-CM | POA: Diagnosis not present

## 2015-09-07 NOTE — Progress Notes (Signed)
Remote pacemaker transmission.   

## 2015-09-08 ENCOUNTER — Other Ambulatory Visit: Payer: Self-pay | Admitting: Cardiovascular Disease

## 2015-09-08 LAB — CUP PACEART REMOTE DEVICE CHECK
Battery Impedance: 132 Ohm
Battery Remaining Longevity: 165 mo
Battery Voltage: 2.79 V
Brady Statistic RV Percent Paced: 47 %
Lead Channel Setting Pacing Pulse Width: 0.4 ms
Lead Channel Setting Sensing Sensitivity: 4 mV
MDC IDC MSMT LEADCHNL RA IMPEDANCE VALUE: 67 Ohm
MDC IDC MSMT LEADCHNL RV IMPEDANCE VALUE: 502 Ohm
MDC IDC MSMT LEADCHNL RV PACING THRESHOLD AMPLITUDE: 0.875 V
MDC IDC MSMT LEADCHNL RV PACING THRESHOLD PULSEWIDTH: 0.4 ms
MDC IDC MSMT LEADCHNL RV SENSING INTR AMPL: 8 mV
MDC IDC SESS DTM: 20161009172807
MDC IDC SET LEADCHNL RV PACING AMPLITUDE: 2 V

## 2015-09-08 NOTE — Telephone Encounter (Signed)
REFILL 

## 2015-10-21 ENCOUNTER — Encounter: Payer: Self-pay | Admitting: *Deleted

## 2015-11-02 ENCOUNTER — Ambulatory Visit: Payer: Medicare Other | Admitting: Pharmacist Clinician (PhC)/ Clinical Pharmacy Specialist

## 2015-11-02 ENCOUNTER — Ambulatory Visit: Payer: Medicare Other | Admitting: Cardiovascular Disease

## 2015-11-03 ENCOUNTER — Encounter: Payer: Self-pay | Admitting: Cardiovascular Disease

## 2015-11-11 ENCOUNTER — Ambulatory Visit: Payer: Medicare Other | Admitting: Pharmacist Clinician (PhC)/ Clinical Pharmacy Specialist

## 2015-12-03 ENCOUNTER — Other Ambulatory Visit: Payer: Self-pay | Admitting: Cardiovascular Disease

## 2015-12-03 NOTE — Telephone Encounter (Signed)
REFILL 

## 2015-12-05 ENCOUNTER — Other Ambulatory Visit: Payer: Self-pay | Admitting: Cardiovascular Disease

## 2015-12-21 ENCOUNTER — Encounter: Payer: Medicare Other | Admitting: Cardiovascular Disease

## 2015-12-29 ENCOUNTER — Encounter: Payer: Medicare Other | Admitting: Cardiovascular Disease

## 2015-12-30 ENCOUNTER — Other Ambulatory Visit: Payer: Self-pay | Admitting: Cardiovascular Disease

## 2015-12-31 NOTE — Telephone Encounter (Signed)
Rx(s) sent to pharmacy electronically.  

## 2016-01-07 ENCOUNTER — Encounter: Payer: Medicare Other | Admitting: Cardiovascular Disease

## 2016-01-09 ENCOUNTER — Other Ambulatory Visit: Payer: Self-pay | Admitting: Cardiovascular Disease

## 2016-01-11 NOTE — Telephone Encounter (Signed)
Rx request sent to pharmacy.  

## 2016-01-29 ENCOUNTER — Encounter: Payer: Medicare Other | Admitting: Cardiovascular Disease

## 2016-02-24 ENCOUNTER — Ambulatory Visit (INDEPENDENT_AMBULATORY_CARE_PROVIDER_SITE_OTHER): Payer: Medicare Other | Admitting: *Deleted

## 2016-02-24 ENCOUNTER — Encounter: Payer: Self-pay | Admitting: Cardiovascular Disease

## 2016-02-24 ENCOUNTER — Ambulatory Visit (INDEPENDENT_AMBULATORY_CARE_PROVIDER_SITE_OTHER): Payer: Medicare Other | Admitting: Cardiovascular Disease

## 2016-02-24 VITALS — BP 129/73 | HR 68 | Ht 63.0 in | Wt 148.0 lb

## 2016-02-24 DIAGNOSIS — I4891 Unspecified atrial fibrillation: Secondary | ICD-10-CM | POA: Diagnosis not present

## 2016-02-24 DIAGNOSIS — I482 Chronic atrial fibrillation, unspecified: Secondary | ICD-10-CM

## 2016-02-24 DIAGNOSIS — E782 Mixed hyperlipidemia: Secondary | ICD-10-CM

## 2016-02-24 DIAGNOSIS — R001 Bradycardia, unspecified: Secondary | ICD-10-CM

## 2016-02-24 DIAGNOSIS — Z45010 Encounter for checking and testing of cardiac pacemaker pulse generator [battery]: Secondary | ICD-10-CM

## 2016-02-24 DIAGNOSIS — R600 Localized edema: Secondary | ICD-10-CM

## 2016-02-24 DIAGNOSIS — Z7901 Long term (current) use of anticoagulants: Secondary | ICD-10-CM

## 2016-02-24 LAB — POCT INR: INR: 1.4

## 2016-02-24 NOTE — Patient Instructions (Signed)
Remote monitoring is used to monitor your Pacemaker of ICD from home. This monitoring reduces the number of office visits required to check your device to one time per year. It allows us to keep an eye on the functioning of your device to ensure it is working properly. You are scheduled for a device check from home on May 27, 2016. You may send your transmission at any time that day. If you have a wireless device, the transmission will be sent automatically. After your physician reviews your transmission, you will receive a postcard with your next transmission date.  Dr Royann Shiversroitoru recommends that you schedule a follow-up appointment in 6 months. You will receive a reminder letter in the mail two months in advance. If you don't receive a letter, please call our office to schedule the follow-up appointment.  If you need a refill on your cardiac medications before your next appointment, please call your pharmacy.

## 2016-02-24 NOTE — Progress Notes (Signed)
Patient ID: Jacqueline Mendoza, female   DOB: 03/18/1933, 82 y.o.   MRN: [id number]    Cardiology Office Note    Date:  02/25/2016   ID:  Jacqueline RendMaurene C Warnell, DOB 12/11/1932, MRN [id number]  PCP:  Alva GarnetSHELTON,KIMBERLY R., MD  Cardiologist:   Thurmon FairROITORU,Shannette Tabares, MD   Chief Complaint  Patient presents with  . 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 She has treated systemic hypertension and a history of mixed hyperlipidemia and gout. She is on long-standing warfarin anticoagulation without problems. By echocardiography has normal left ventricular systolic function, left ventricular hypertrophy and a moderate to severely dilated left atrium. She does not have significant valvular disease.She has chronic venous insufficiency.  He is accompanied today by her daughter. Her daughter also has serious medical problems. There is spurring seen financial difficulties and have a lot of problems with transportation and access to medical care. Mrs. Jacqueline Mendoza has not had her prothrombin time checked in a very long time. Since her last appointment her memory has declined substantially, as has her appetite. She has lost almost 50 pounds in the last 18 months. Despite this she actually appears quite edematous and her true weight is even lower than reflected by the scale. She denies shortness of breath, palpitations, chest discomfort, dizziness, syncope, seizures bleeding problems. She does complain of fatigue. Her daughter believes that she is short of breath but will not admit it.  She has missed several doses of diuretics because the medications ran  out.  Pacemaker interrogation shows normal device function. Her dual-chamber device is programmed VVIR for permanent atrial fibrillation. Estimated generator longevity is 13.5 years. She has 44% ventricular pacing and 56% ventricular sensed rhythm. There are very rare episodes of high ventricular rate which all appear to be rapid ventricular response from atrial fibrillation.  Past Medical History  Diagnosis Date  . SSS (sick sinus syndrome) (HCC)   . PAF (paroxysmal atrial fibrillation) (HCC)   . Venous insufficiency     Chronic, mild    Past Surgical History  Procedure Laterality Date  . Cardiac catheterization  09/21/1998    Mild 2 vessel disease (50% lesion of proximal left circumflex and 100% occlusion of PDA-not felt to be amenable to intervention). Recommend medical therapy.  . Cardiovascular stress test  07/26/2002    Normal study. EF 81%\  . Cardiac catheterization  01/29/2007    Normal coronaries. 100% PDA occlusion. Recommend permanent pacemaker for tachy-brady syndrome.  . Pacemaker insertion  01/30/2007    Medtronic EnRhythm, model D1939726#[id number], serial [id number]  . Venous duplex  06/14/2011    No evidence of thrombus or thrombophlebitis  . Transthoracic echocardiogram  11/14/2012    EF 55-65%, mild-moderate mitral valve regurg, rt atrium moderatelty dilated  . Pacemaker generator change  05/17/2013    Medtronic AdaptaL, model #ADDRL1, serial [id number]  . Permanent pacemaker generator change N/A 05/17/2013    Procedure: PERMANENT PACEMAKER GENERATOR CHANGE;  Surgeon: Thurmon FairMihai Patsey Pitstick, MD;  Location: [facility];  Service: Cardiovascular;  Laterality: N/A;    Current Medications: Outpatient Prescriptions Prior to Visit  Medication Sig Dispense Refill  .  allopurinol (ZYLOPRIM) 100 MG tablet Take 1 tablet (100 mg total) by mouth daily. 30 tablet 3  . aspirin 81 MG tablet Take 81 mg by mouth daily.    . Cholecalciferol (VITAMIN D) 2000 UNITS tablet Take 2,000 Units by mouth 2 (two)  times daily.    . colchicine 0.6 MG tablet Take 0.6 mg by mouth daily.    Marland Kitchen darifenacin (ENABLEX) 15 MG 24 hr tablet Take 15 mg by mouth daily.    . metoprolol (LOPRESSOR) 50 MG tablet TAKE 1 TABLET(50 MG) BY MOUTH TWICE DAILY 180 tablet 2  . Multiple Vitamin (MULTIVITAMIN WITH MINERALS) TABS Take 1 tablet by mouth daily.    . potassium chloride SA (K-DUR,KLOR-CON) 20 MEQ tablet TAKE 1 TABLET BY MOUTH DAILY 30 tablet 1  . torsemide (DEMADEX) 20 MG tablet TAKE 1 TABLET BY MOUTH DAILY 30 tablet 3  . warfarin (COUMADIN) 5 MG tablet TAKE 1 TABLET BY MOUTH DAILY OR AS DIRECTED 90 tablet 0  . co-enzyme Q-10 30 MG capsule Take 30 mg by mouth daily. Reported on 02/24/2016    . omega-3 acid ethyl esters (LOVAZA) 1 G capsule Take 1 g by mouth daily. Reported on 02/24/2016    . simvastatin (ZOCOR) 40 MG tablet take 1 tablet by mouth daily in the evening (Patient not taking: Reported on 02/24/2016) 90 tablet 2   No facility-administered medications prior to visit.     Allergies:   Quinine derivatives   Social History   Social History  . Marital Status: Single    Spouse Name: N/A  . Number of Children: N/A  . Years of Education: N/A   Social History Main Topics  . Smoking status: Never Smoker   . Smokeless tobacco: Never Used  . Alcohol Use: Yes     Comment: occas.  . Drug Use: None  . Sexual Activity: Not Asked   Other Topics Concern  . None   Social History Narrative      ROS:   Please see the history of present illness.    ROS All other systems reviewed and are negative.   PHYSICAL EXAM:   VS:  BP 129/73 mmHg  Pulse 68  Ht  (1.6 m)  Wt 67.132 kg (148 lb)  BMI 26.22 kg/m2   GEN: Well nourished, well developed, in no acute distress HEENT: normal Neck: no JVD, carotid bruits, or masses Cardiac: Irregular;  no murmurs, rubs, or gallops, symmetrical 3+ pitting pretibial edema almost to the knees; healthy subclavian pacemaker site. Respiratory:  clear to auscultation  bilaterally, normal work of breathing GI: soft, nontender, nondistended, + BS MS: no deformity or atrophy Skin: warm and dry, no rash Neuro:  Alert and Oriented x 3, Strength and sensation are intact Psych: euthymic mood, full affect; short-term memory appears intact, but long-term memory is normal  Wt Readings from Last 3 Encounters:  02/24/16 67.132 kg (148 lb)  05/01/15 74.571 kg (164 lb 6.4 oz)  07/05/14 86.183 kg (190 lb)      Studies/Labs Reviewed:   EKG:  EKG is ordered today.  The ekg ordered today demonstrates Atrial fibrillation with occasional ventricular paced beats  INR today was 1.4   ASSESSMENT:    1. Chronic atrial fibrillation (HCC)   2. Symptomatic bradycardia   3. Pacemaker - Medtronic (dual ch. programmed VVIR)   4. Long term current use of anticoagulant therapy   5. Mixed hyperlipidemia      PLAN:  In order of problems listed above:  1. Permanent atrial fibrillation Rate control is appropriate. I think we are achieving a decent balance between avoidance of high ventricular rates and avoidance of excessive ventricular pacing. I doubt that the ventricular pacing is the cause for her worsening edema. I am very concerned about the difficulties she is having with prothrombin time monitoring. The current situation is quite risky. The 2 options are continued warfarin anticoagulation with home prothrombin time monitoring (We'll try to see if she qualifies for home monitor) versus switching to a direct oral anticoagulant (I prefer the latter option) we do not have recent renal function parameters and these should be checked before making the switch. In the past Mrs. Maruyama has been very reluctant to switch from warfarin to novel agents both due to her perception of increased bleeding risk as well as the increased cost. According to her daughter the cost will she not be an issue due to the current insurance set up. We'll check labs, verify her eligibility and plan to use  of a close twice daily, adjusted for current renal function. If this is not an option, we need to recheck prothrombin time within a week. Maybe we can set up home health nurse visits. 2. Bradycardia: Appropriate pacemaker settings, heart rate histogram distribution is appropriate for her level of activity 3. Pacemaker: Normal device function. Remote downloads every 3 months and office visit at least once yearly 4. Anticoagulation: See considerations above 5. HLP: No recent labs available for review 6. Edema: Should benefit from enhanced diuretic therapy, but I would like to hold off on recommending this until we have labs available. We'll try to get labs now or at least obtain more recent assays from her primary care physician    Medication Adjustments/Labs and Tests Ordered: Current medicines are reviewed at length with the patient today.  Concerns regarding medicines are outlined above.  Medication changes, Labs and Tests ordered today are listed in the Patient Instructions below. Patient Instructions  Remote monitoring is used to monitor your Pacemaker of ICD from home. This monitoring reduces the number of office visits required to check your device to one time per year. It allows [place] to keep an eye on the functioning of your device to ensure it is working properly. You are scheduled for a device check from home on May 27, 2016. You may send your transmission at any time that day. If you have a wireless device, the transmission will be sent automatically. After your physician reviews your transmission, you will receive a postcard with your next transmission date.  Dr Royann Shivers recommends that you schedule a follow-up appointment in 6 months. You will receive a reminder letter in the mail two months in advance. If you don't receive a letter, please call our office to schedule the follow-up appointment.  If you need a refill on your cardiac medications before your next appointment, please call your  pharmacy.    Joie Bimler, MD  02/25/2016 3:09 PM    [facility] [address] [place], [place], [place]  [address] Phone: [phone]; Fax: [phone]

## 2016-02-26 ENCOUNTER — Telehealth: Payer: Self-pay

## 2016-02-26 MED ORDER — APIXABAN 2.5 MG PO TABS: 2.5000 mg | ORAL_TABLET | Freq: Two times a day (BID) | ORAL | Status: DC

## 2016-02-26 NOTE — Telephone Encounter (Signed)
-----   Message from Thurmon FairMihai Croitoru, MD sent at 02/25/2016  3:19 PM EDT ----- Just following up to make sure we were going to request labs from PCP. Wanted to know her kidney function before switching to Eliquis Also, could you touch base and make sure that they have refills for her torsemide? She was quite edematous but had missed this medication for a few days when they run out.

## 2016-02-26 NOTE — Telephone Encounter (Signed)
Called for labs this am.  Labs received and shown to [place], PharmD.   Creatinine is 1.85.   Kristin recommended Eliquis 2.5 BID starting tomorrow night. Called patient and spoke with daughter. [facility]

## 2016-02-29 NOTE — Telephone Encounter (Signed)
Thank you, Kristin 

## 2016-03-01 ENCOUNTER — Telehealth: Payer: Self-pay

## 2016-03-01 LAB — CUP PACEART INCLINIC DEVICE CHECK
Battery Impedance: 132 Ohm
Battery Remaining Longevity: 165 mo
Battery Voltage: 2.79 V
Brady Statistic RV Percent Paced: 44 %
Implantable Lead Implant Date: 20080304
Implantable Lead Location: 753859
Implantable Lead Location: 753860
Implantable Lead Model: 5076
Implantable Lead Model: 5076
Lead Channel Impedance Value: 473 Ohm
MDC IDC LEAD IMPLANT DT: 20080304
MDC IDC MSMT LEADCHNL RA IMPEDANCE VALUE: 67 Ohm
MDC IDC SESS DTM: 20170329182256
MDC IDC SET LEADCHNL RV PACING AMPLITUDE: 2 V
MDC IDC SET LEADCHNL RV PACING PULSEWIDTH: 0.4 ms
MDC IDC SET LEADCHNL RV SENSING SENSITIVITY: 4 mV

## 2016-03-01 NOTE — Telephone Encounter (Signed)
Prior auth for Eliquis 2.5mg submitted to [facility]. 

## 2016-03-03 ENCOUNTER — Telehealth: Payer: Self-pay

## 2016-03-03 NOTE — Telephone Encounter (Signed)
Eliquis approved through 11/27/2016. [id number].

## 2016-03-07 ENCOUNTER — Encounter: Payer: Self-pay | Admitting: Cardiovascular Disease

## 2016-03-20 ENCOUNTER — Other Ambulatory Visit: Payer: Self-pay | Admitting: Cardiovascular Disease

## 2016-05-30 ENCOUNTER — Telehealth: Payer: Self-pay | Admitting: Cardiology

## 2016-05-30 ENCOUNTER — Encounter: Payer: Medicare Other | Admitting: *Deleted

## 2016-05-30 NOTE — Telephone Encounter (Signed)
Confirmed remote transmission w/ pt daughter.   

## 2016-06-03 ENCOUNTER — Encounter: Payer: Self-pay | Admitting: Cardiology

## 2016-06-16 ENCOUNTER — Ambulatory Visit (INDEPENDENT_AMBULATORY_CARE_PROVIDER_SITE_OTHER): Payer: Medicare Other | Admitting: *Deleted

## 2016-06-16 DIAGNOSIS — Z45010 Encounter for checking and testing of cardiac pacemaker pulse generator [battery]: Secondary | ICD-10-CM | POA: Diagnosis not present

## 2016-06-16 DIAGNOSIS — I4891 Unspecified atrial fibrillation: Secondary | ICD-10-CM | POA: Diagnosis not present

## 2016-06-17 ENCOUNTER — Encounter: Payer: Self-pay | Admitting: Cardiology

## 2016-06-17 NOTE — Progress Notes (Signed)
Remote pacemaker transmission.   

## 2016-06-28 LAB — CUP PACEART REMOTE DEVICE CHECK
Battery Impedance: 156 Ohm
Battery Voltage: 2.79 V
Brady Statistic RV Percent Paced: 47 %
Date Time Interrogation Session: 20170720184016
Implantable Lead Implant Date: 20080304
Implantable Lead Location: 753859
Implantable Lead Location: 753860
Implantable Lead Model: 5076
Lead Channel Impedance Value: 500 Ohm
Lead Channel Impedance Value: 67 Ohm
Lead Channel Pacing Threshold Amplitude: 0.875 V
Lead Channel Pacing Threshold Pulse Width: 0.4 ms
Lead Channel Setting Sensing Sensitivity: 4 mV
MDC IDC LEAD IMPLANT DT: 20080304
MDC IDC MSMT BATTERY REMAINING LONGEVITY: 158 mo
MDC IDC SET LEADCHNL RV PACING AMPLITUDE: 2 V
MDC IDC SET LEADCHNL RV PACING PULSEWIDTH: 0.4 ms

## 2016-07-06 ENCOUNTER — Encounter: Payer: Self-pay | Admitting: Cardiology

## 2016-09-15 ENCOUNTER — Other Ambulatory Visit: Payer: Self-pay | Admitting: Cardiovascular Disease

## 2016-09-15 NOTE — Telephone Encounter (Signed)
REFILL 

## 2016-10-14 ENCOUNTER — Other Ambulatory Visit: Payer: Self-pay | Admitting: Cardiovascular Disease

## 2016-11-11 ENCOUNTER — Other Ambulatory Visit: Payer: Self-pay | Admitting: Cardiovascular Disease

## 2016-12-05 ENCOUNTER — Ambulatory Visit: Payer: Medicare Other | Admitting: Cardiovascular Disease

## 2016-12-07 ENCOUNTER — Ambulatory Visit: Payer: Medicare Other | Admitting: Cardiovascular Disease

## 2016-12-30 ENCOUNTER — Telehealth: Payer: Self-pay | Admitting: Cardiovascular Disease

## 2016-12-30 NOTE — Telephone Encounter (Signed)
Left message - yes flu shot is available with  Regular office appointment with cardiologist

## 2016-12-30 NOTE — Telephone Encounter (Signed)
New Message     Are you giving the flu shot at your office?? Pt is homebound and can not get out to get flu shot

## 2017-01-13 ENCOUNTER — Ambulatory Visit: Payer: Medicare Other | Admitting: Cardiovascular Disease

## 2017-03-02 ENCOUNTER — Other Ambulatory Visit: Payer: Self-pay | Admitting: Cardiovascular Disease

## 2017-03-02 NOTE — Telephone Encounter (Signed)
REFILL 

## 2017-03-09 ENCOUNTER — Other Ambulatory Visit: Payer: Self-pay | Admitting: Pharmacist Clinician (PhC)/ Clinical Pharmacy Specialist

## 2017-04-03 ENCOUNTER — Encounter: Payer: Medicare Other | Admitting: Cardiovascular Disease

## 2017-04-08 ENCOUNTER — Other Ambulatory Visit: Payer: Self-pay | Admitting: Cardiovascular Disease

## 2017-05-18 ENCOUNTER — Encounter: Payer: Self-pay | Admitting: Cardiovascular Disease

## 2017-05-20 ENCOUNTER — Other Ambulatory Visit: Payer: Self-pay | Admitting: Cardiovascular Disease

## 2017-05-22 NOTE — Telephone Encounter (Signed)
REFILL 

## 2017-06-19 ENCOUNTER — Other Ambulatory Visit: Payer: Self-pay | Admitting: Cardiovascular Disease

## 2017-06-21 ENCOUNTER — Telehealth: Payer: Self-pay | Admitting: Pharmacist Clinician (PhC)/ Clinical Pharmacy Specialist

## 2017-06-21 DIAGNOSIS — I482 Chronic atrial fibrillation, unspecified: Secondary | ICD-10-CM

## 2017-06-21 MED ORDER — APIXABAN 2.5 MG PO TABS: ORAL_TABLET | ORAL | 0 refills | Status: DC

## 2017-06-21 NOTE — Telephone Encounter (Signed)
Reviewed with Dr. Royann Shiversroitoru - patient has not been seen or had labs since 01-2016.   Refilled Eliquis x 1 month and LMOM that patient will need to get lab drawn before we can refill again.  Lab mailed to address in system

## 2017-07-26 ENCOUNTER — Other Ambulatory Visit: Payer: Self-pay | Admitting: Cardiovascular Disease

## 2017-07-27 ENCOUNTER — Telehealth: Payer: Self-pay | Admitting: Pharmacist Clinician (PhC)/ Clinical Pharmacy Specialist

## 2017-07-27 NOTE — Telephone Encounter (Signed)
Spoke with daughter Tora DuckSheryl - she has not been able to get patient to lab due to own health issues.  Patient has appointment with PCP in about 2-3 weeks.  Daughter will get blood work drawn at that time.    Currently has appt scheduled for Dr. Salena Saner on October 11

## 2017-07-31 ENCOUNTER — Other Ambulatory Visit: Payer: Self-pay | Admitting: Cardiovascular Disease

## 2017-08-01 NOTE — Telephone Encounter (Signed)
REFILL 

## 2017-08-07 ENCOUNTER — Other Ambulatory Visit: Payer: Self-pay | Admitting: Pharmacist Clinician (PhC)/ Clinical Pharmacy Specialist

## 2017-08-07 ENCOUNTER — Other Ambulatory Visit: Payer: Self-pay | Admitting: Cardiovascular Disease

## 2017-08-08 NOTE — Telephone Encounter (Signed)
REFILL 

## 2017-08-18 ENCOUNTER — Other Ambulatory Visit: Payer: Self-pay | Admitting: Cardiovascular Disease

## 2017-09-06 ENCOUNTER — Encounter: Payer: Medicare Other | Admitting: Cardiovascular Disease

## 2017-09-12 ENCOUNTER — Other Ambulatory Visit: Payer: Self-pay | Admitting: Cardiovascular Disease

## 2017-09-12 NOTE — Telephone Encounter (Signed)
recent blood work?

## 2017-09-24 ENCOUNTER — Other Ambulatory Visit: Payer: Self-pay | Admitting: Cardiovascular Disease

## 2017-09-25 ENCOUNTER — Other Ambulatory Visit: Payer: Self-pay | Admitting: Cardiovascular Disease

## 2017-09-25 NOTE — Telephone Encounter (Signed)
REFILL 

## 2017-10-04 ENCOUNTER — Encounter: Payer: Medicare Other | Admitting: Cardiovascular Disease

## 2017-10-05 ENCOUNTER — Encounter: Payer: Self-pay | Admitting: *Deleted

## 2017-11-08 ENCOUNTER — Other Ambulatory Visit: Payer: Self-pay | Admitting: Cardiovascular Disease

## 2017-11-08 NOTE — Telephone Encounter (Signed)
Thoughts?

## 2017-11-08 NOTE — Telephone Encounter (Signed)
Pt is coming in next Tuesday for her lab work.Please have an order in place if possible please. Daughter have been extremely sick unable to get pt to her appointments. Daughter is in this hospital at present. If possible please refill her Eliquis and Furosemide until appointment can be arranged, Pt is totally out of these medicine. Please call to [facility] [address],[place].

## 2017-11-10 ENCOUNTER — Other Ambulatory Visit: Payer: Self-pay | Admitting: Cardiovascular Disease

## 2017-11-10 MED ORDER — TORSEMIDE 20 MG PO TABS: 20.0000 mg | ORAL_TABLET | Freq: Every day | ORAL | 0 refills | Status: AC

## 2017-11-10 NOTE — Telephone Encounter (Signed)
New Message     *STAT* If patient is at the pharmacy, call can be transferred to refill team.   1. Which medications need to be refilled? (please list name of each medication and dose if known)  Eliquis and Torsemide  2. Which pharmacy/location (including street and city if local pharmacy) is medication to be sent to? [facility] [address] [place]  3. Do they need a 30 day or 90 day supply? 90 day supply Patient is out and has not had medication since last week Monday

## 2017-11-13 MED ORDER — APIXABAN 2.5 MG PO TABS: 2.5000 mg | ORAL_TABLET | Freq: Two times a day (BID) | ORAL | 0 refills | Status: DC

## 2017-11-14 ENCOUNTER — Telehealth: Payer: Self-pay | Admitting: Cardiovascular Disease

## 2017-11-14 ENCOUNTER — Telehealth: Payer: Self-pay | Admitting: *Deleted

## 2017-11-14 NOTE — Telephone Encounter (Signed)
I created duplicate note by mistake. Pt's daughter called to request appt w Dr. Royann Shiversroitoru and refill of Eliquis until scheduled appt. She also requested lab slips to be mailed to patient home. I have made the appt and sent lab slips in outgoing mail. Sent request for refills to pharmD to authorize.

## 2017-11-14 NOTE — Telephone Encounter (Signed)
New Message  Pt call requesting to speak with RN about getting lab orders for pt to complain in [place]. Please call back to discuss

## 2017-11-14 NOTE — Telephone Encounter (Signed)
Pt overdue to see Dr. Royann Shiversroitoru. Daughter explains she herself has been sick, and was following up regarding need to have patient return for cardiology visit. She requested an appt to be scheduled. Also requested pt have lab slip mailed to home - informed her I would send out today. Pt needs refills for Eliquis authorized through April 15th (soonest appt I could get her d/t daughter requesting afternoon times ONLY) - aware I will send to pharmD to authorize. She voiced thanks for call, no further needs identified.

## 2017-11-16 NOTE — Telephone Encounter (Signed)
Kristin,would you please sign this encounter,thank you so much.

## 2017-11-17 MED ORDER — APIXABAN 2.5 MG PO TABS
2.5000 mg | ORAL_TABLET | Freq: Two times a day (BID) | ORAL | 0 refills | Status: AC
Start: 2017-11-17 — End: ?

## 2017-11-17 NOTE — Telephone Encounter (Signed)
Patient has repeatedly cancelled/missed appointments.  Not seen by Dr. Royann Shiversroitoru since March 2017.  [facility] with SCr of 1.85.  Based on age and that her dose should be 2.5 mg twice daily.  However cannot reliably dose based on SCr that is > 17110 years old. This is what she has been taking, unknown if she is being undertreated or not.  Will give 30 days only with the understanding that we will not refill without labs or Dr. Erin Hearingroitoru's permission.

## 2018-01-20 ENCOUNTER — Other Ambulatory Visit: Payer: Self-pay | Admitting: Cardiovascular Disease

## 2018-03-12 ENCOUNTER — Ambulatory Visit: Payer: Medicare Other | Admitting: Cardiovascular Disease

## 2018-04-10 ENCOUNTER — Other Ambulatory Visit: Payer: Self-pay | Admitting: Cardiovascular Disease

## 2018-06-08 ENCOUNTER — Ambulatory Visit: Payer: Medicare Other | Admitting: Cardiovascular Disease

## 2018-06-13 ENCOUNTER — Other Ambulatory Visit: Payer: Self-pay | Admitting: Cardiovascular Disease

## 2018-06-14 NOTE — Telephone Encounter (Signed)
Rx request sent to pharmacy.  

## 2018-07-27 ENCOUNTER — Encounter: Payer: Medicare Other | Admitting: Cardiovascular Disease

## 2018-07-31 ENCOUNTER — Other Ambulatory Visit: Payer: Self-pay | Admitting: Cardiovascular Disease

## 2018-08-22 ENCOUNTER — Encounter: Payer: Medicare Other | Admitting: Cardiovascular Disease

## 2018-10-02 NOTE — Progress Notes (Signed)
Created for excessive No Show for provider appointments

## 2018-10-04 ENCOUNTER — Telehealth: Payer: Self-pay | Admitting: Cardiovascular Disease

## 2018-10-04 NOTE — Telephone Encounter (Signed)
Patient dismissed from [facility] by Dr. Royann Shivers, effective 10/02/18. Dismissal letter sent out by 1st class mail. LM

## 2018-10-24 ENCOUNTER — Other Ambulatory Visit: Payer: Self-pay | Admitting: Cardiovascular Disease

## 2018-10-30 ENCOUNTER — Encounter: Payer: Medicare Other | Admitting: Cardiovascular Disease

## 2018-11-07 ENCOUNTER — Telehealth: Payer: Self-pay | Admitting: Cardiovascular Disease

## 2018-11-07 NOTE — Telephone Encounter (Signed)
°  Patient's daughter calling, states she was unable to bring her mother to last office visit due to her own hospitalization.  Advised daughter of dismissal from [facility]. Daughter is requesting to speak with Dr Royann Shiversroitoru.

## 2018-12-30 ENCOUNTER — Other Ambulatory Visit: Payer: Self-pay | Admitting: Cardiovascular Disease

## 2019-01-24 ENCOUNTER — Telehealth: Payer: Self-pay | Admitting: Cardiovascular Disease

## 2019-01-24 NOTE — Telephone Encounter (Signed)
New Message   Jacqueline Mendoza is calling because the PT has a Visual merchandiser and has a request for MRI of the brain and needs to know about the pacemaker before they can schedule the appointment.  Please call back

## 2019-01-24 NOTE — Telephone Encounter (Signed)
I called and left my direct number for the patient to call me back.

## 2019-01-28 NOTE — Telephone Encounter (Signed)
Spoke w/ [facility] they informed me that the MRI has been cancelled.

## 2019-02-04 ENCOUNTER — Encounter (HOSPITAL_COMMUNITY): Payer: Self-pay | Admitting: Emergency Medicine

## 2019-02-04 ENCOUNTER — Other Ambulatory Visit: Payer: Self-pay

## 2019-02-04 ENCOUNTER — Emergency Department (HOSPITAL_COMMUNITY)
Admission: EM | Admit: 2019-02-04 | Discharge: 2019-02-04 | Disposition: A | Discharge status: Home / Self Care | Payer: Medicare Other | Attending: Emergency Medicine | Admitting: Emergency Medicine

## 2019-02-04 DIAGNOSIS — I4891 Unspecified atrial fibrillation: Secondary | ICD-10-CM | POA: Insufficient documentation

## 2019-02-04 DIAGNOSIS — N183 Chronic kidney disease, stage 3 (moderate): Secondary | ICD-10-CM | POA: Insufficient documentation

## 2019-02-04 DIAGNOSIS — I129 Hypertensive chronic kidney disease with stage 1 through stage 4 chronic kidney disease, or unspecified chronic kidney disease: Secondary | ICD-10-CM | POA: Diagnosis not present

## 2019-02-04 DIAGNOSIS — R55 Syncope and collapse: Secondary | ICD-10-CM | POA: Insufficient documentation

## 2019-02-04 DIAGNOSIS — Z7901 Long term (current) use of anticoagulants: Secondary | ICD-10-CM | POA: Insufficient documentation

## 2019-02-04 DIAGNOSIS — Z95810 Presence of automatic (implantable) cardiac defibrillator: Secondary | ICD-10-CM | POA: Diagnosis not present

## 2019-02-04 LAB — CBG MONITORING, ED: GLUCOSE-CAPILLARY: 98 mg/dL (ref 70–99)

## 2019-02-04 LAB — BASIC METABOLIC PANEL
Anion gap: 8 (ref 5–15)
BUN: 17 mg/dL (ref 8–23)
CO2: 26 mmol/L (ref 22–32)
CREATININE: 1.67 mg/dL — AB (ref 0.44–1.00)
Calcium: 10.5 mg/dL — ABNORMAL HIGH (ref 8.9–10.3)
Chloride: 106 mmol/L (ref 98–111)
GFR calc Af Amer: 32 mL/min — ABNORMAL LOW (ref >60)
GFR calc non Af Amer: 28 mL/min — ABNORMAL LOW (ref >60)
Glucose, Bld: 101 mg/dL — ABNORMAL HIGH (ref 70–99)
Potassium: 3.5 mmol/L (ref 3.5–5.1)
Sodium: 140 mmol/L (ref 135–145)

## 2019-02-04 LAB — CBC WITH DIFFERENTIAL/PLATELET
Abs Immature Granulocytes: 0.07 10*3/uL (ref 0.00–0.07)
Basophils Absolute: 0.1 10*3/uL (ref 0.0–0.1)
Basophils Relative: 1 %
EOS PCT: 2 %
Eosinophils Absolute: 0.1 10*3/uL (ref 0.0–0.5)
HCT: 43.7 % (ref 36.0–46.0)
Hemoglobin: 14.6 g/dL (ref 12.0–15.0)
IMMATURE GRANULOCYTES: 1 %
Lymphocytes Relative: 29 %
Lymphs Abs: 2 10*3/uL (ref 0.7–4.0)
MCH: 30.8 pg (ref 26.0–34.0)
MCHC: 33.4 g/dL (ref 30.0–36.0)
MCV: 92.2 fL (ref 80.0–100.0)
Monocytes Absolute: 0.7 10*3/uL (ref 0.1–1.0)
Monocytes Relative: 10 %
Neutro Abs: 3.9 10*3/uL (ref 1.7–7.7)
Neutrophils Relative %: 57 %
Platelets: 232 10*3/uL (ref 150–400)
RBC: 4.74 MIL/uL (ref 3.87–5.11)
RDW: 14.3 % (ref 11.5–15.5)
WBC: 6.8 10*3/uL (ref 4.0–10.5)
nRBC: 0 % (ref 0.0–0.2)

## 2019-02-04 LAB — MAGNESIUM: MAGNESIUM: 1.9 mg/dL (ref 1.7–2.4)

## 2019-02-04 MED ORDER — SODIUM CHLORIDE 0.9 % IV BOLUS: 500.0000 mL | Freq: Once | INTRAVENOUS | Status: DC

## 2019-02-04 MED ORDER — ONDANSETRON 4 MG PO TBDP
4.0000 mg | ORAL_TABLET | Freq: Once | ORAL | Status: AC
  Administered 2019-02-04: 4 mg via ORAL
  Filled 2019-02-04: qty 1

## 2019-02-04 NOTE — ED Triage Notes (Signed)
Per EMS pt from [facility] with second syncopal event while having a BM in a week.  Pt has similar event 1 week ago and was placed at [facility].  Today she was on the toilet having a BM and had a syncopal event.  She did not fall but did have one round of emesis when she woke up no trauma noted pt denies pain AOx4

## 2019-02-04 NOTE — ED Notes (Signed)
Pacemaker report given to EDP 

## 2019-02-04 NOTE — ED Provider Notes (Signed)
[facility] EMERGENCY DEPARTMENT Provider Note   CSN: [id number] Arrival date & time: 02/04/19  1157    History   Chief Complaint Chief Complaint  Patient presents with  . Loss of Consciousness    HPI Jacqueline Mendoza is a 85 y.o. female with history of hypertension, symptomatic bradycardia, pacemaker, A. Fib, CKD stage 3.  Patient is on long-term Eliquis.  She reports compliance with her medications. She is presenting to emergency department today with chief complaint of syncope.  Onset was acute, happening just prior to arrival.   This morning patient had witnessed syncopal episode by physical therapy staff. After having a bowel movement  pt laid her head back against the wall and was unresponsive.  Staff reports when she came back to she vomited 1 time.  They were able to get her back in bed and states her mentation improved. Pt did not fall. She continued to feel nauseous after vomiting.  Patient denies fever, abdominal pain, headache, chest pain, shortness of breath, palpitations, visual changes.    Past Medical History:  Diagnosis Date  . PAF (paroxysmal atrial fibrillation) (HCC)   . SSS (sick sinus syndrome) (HCC)   . Venous insufficiency    Chronic, mild    Patient Active Problem List   Diagnosis Date Noted  . HTN (hypertension) 06/22/2014  . Mixed hyperlipidemia 06/22/2014  . Pacemaker - Medtronic (dual ch. programmed VVIR) 05/17/2013  . Symptomatic bradycardia 05/17/2013  . Atrial fibrillation (HCC) 03/16/2013  . Long term current use of anticoagulant therapy 03/16/2013    Past Surgical History:  Procedure Laterality Date  . CARDIAC CATHETERIZATION  09/21/1998   Mild 2 vessel disease (50% lesion of proximal left circumflex and 100% occlusion of PDA-not felt to be amenable to intervention). Recommend medical therapy.  Marland Kitchen CARDIAC CATHETERIZATION  01/29/2007   Normal coronaries. 100% PDA occlusion. Recommend permanent pacemaker for tachy-brady  syndrome.  Marland Kitchen CARDIOVASCULAR STRESS TEST  07/26/2002   Normal study. EF 81%\  . PACEMAKER GENERATOR CHANGE  05/17/2013   Medtronic AdaptaL, model #ADDRL1, serial [id number]  . PACEMAKER INSERTION  01/30/2007   Medtronic EnRhythm, model [id number], serial [id number]  . PERMANENT PACEMAKER GENERATOR CHANGE N/A 05/17/2013   Procedure: PERMANENT PACEMAKER GENERATOR CHANGE;  Surgeon: Thurmon Fair, MD;  Location: [facility] CATH LAB;  Service: Cardiovascular;  Laterality: N/A;  . TRANSTHORACIC ECHOCARDIOGRAM  11/14/2012   EF 55-65%, mild-moderate mitral valve regurg, rt atrium moderatelty dilated  . VENOUS DUPLEX  06/14/2011   No evidence of thrombus or thrombophlebitis     OB History   No obstetric history on file.      Home Medications    Prior to Admission medications   Medication Sig Start Date End Date Taking? Authorizing Provider  allopurinol (ZYLOPRIM) 100 MG tablet Take 1 tablet (100 mg total) by mouth daily. 11/06/13   Croitoru, Mihai, MD  apixaban (ELIQUIS) 2.5 MG TABS tablet Take 1 tablet (2.5 mg total) by mouth 2 (two) times daily. Blood work MUST be done before additional refill 11/17/17   Croitoru, Mihai, MD  aspirin 81 MG tablet Take 81 mg by mouth daily.    [provider]  Cholecalciferol (VITAMIN D) 2000 UNITS tablet Take 2,000 Units by mouth 2 (two) times daily.    [provider]  colchicine 0.6 MG tablet Take 0.6 mg by mouth daily.    [provider]  darifenacin (ENABLEX) 15 MG 24 hr tablet Take 15 mg by mouth daily.    [provider]  metoprolol tartrate (LOPRESSOR) 50 MG tablet TAKE 1 TABLET BY MOUTH TWICE DAILY. KEEP OFFICE VISIT.( GENERIC EQUIVALENT FOR LOPRESSOR) 07/31/18   Croitoru, Mihai, MD  metoprolol tartrate (LOPRESSOR) 50 MG tablet TAKE 1 TABLET BY MOUTH TWICE DAILY. KEEP OFFICE VISIT( GENERIC EQUIVALENT FOR LOPRESSOR) 10/24/18   Croitoru, Mihai, MD  Multiple Vitamin (MULTIVITAMIN WITH MINERALS) TABS Take 1 tablet by mouth daily.     [provider]  potassium chloride SA (K-DUR,KLOR-CON) 20 MEQ tablet TAKE 1 TABLET BY MOUTH DAILY 06/14/18   Croitoru, Mihai, MD  torsemide (DEMADEX) 20 MG tablet Take 1 tablet (20 mg total) by mouth daily. 11/10/17   Croitoru, Rachelle Hora, MD    Family History History reviewed. No pertinent family history.  Social History Social History   Tobacco Use  . Smoking status: Never Smoker  . Smokeless tobacco: Never Used  Substance Use Topics  . Alcohol use: Yes    Comment: occas.  . Drug use: Not on file     Allergies   Quinine derivatives   Review of Systems Review of Systems All other systems are reviewed and are negative for acute change except as noted in the HPI.   Physical Exam Updated Vital Signs BP 133/77   Pulse 60   Temp 97.9 F (36.6 C) (Oral)   Resp (!) 21   Ht 5' 3.5" (1.613 m)   Wt 68 kg   SpO2 100%   BMI 26.15 kg/m   Physical Exam Vitals signs and nursing note reviewed.  Constitutional:      Appearance: She is well-developed. She is not toxic-appearing.  HENT:     Head: Normocephalic and atraumatic.     Nose: Nose normal.     Mouth/Throat:     Mouth: Mucous membranes are moist.     Pharynx: Oropharynx is clear.  Eyes:     General: No scleral icterus.       Right eye: No discharge.        Left eye: No discharge.     Extraocular Movements: Extraocular movements intact.     Conjunctiva/sclera: Conjunctivae normal.     Pupils: Pupils are equal, round, and reactive to light.  Neck:     Musculoskeletal: Normal range of motion.     Comments: Full ROM intact without spinous process TTP. No bony stepoffs or deformities, no paraspinous muscle TTP or muscle spasms. No rigidity or meningeal signs. No bruising, erythema, or swelling.  Cardiovascular:     Rate and Rhythm: Normal rate. Rhythm irregular.     Pulses: Normal pulses.          Radial pulses are 2+ on the right side and 2+ on the left side.       Dorsalis pedis pulses are 2+ on the right  side and 2+ on the left side.     Heart sounds: Normal heart sounds, S1 normal and S2 normal.  Pulmonary:     Effort: Pulmonary effort is normal.     Breath sounds: Normal breath sounds.  Abdominal:     General: Bowel sounds are normal. There is no distension.     Palpations: Abdomen is soft.     Tenderness: There is no abdominal tenderness. There is no guarding or rebound.  Musculoskeletal: Normal range of motion.     Right lower leg: No edema.     Left lower leg: No edema.  Skin:    General: Skin is warm and dry.  Neurological:     Mental Status:  She is oriented to person, place, and time.     Comments: Fluent speech, no facial droop. Speech is clear and goal oriented, follows commands CN III-XII intact, no facial droop Normal strength in upper and lower extremities bilaterally including dorsiflexion and plantar flexion, strong and equal grip strength Sensation normal to light and sharp touch Moves extremities without ataxia, coordination intact Normal finger to nose and rapid alternating movements Patient ambulates with walker.  She ambulated with minimal assistance staff   Psychiatric:        Behavior: Behavior normal.      ED Treatments / Results  Labs (all labs ordered are listed, but only abnormal results are displayed) Labs Reviewed  BASIC METABOLIC PANEL - Abnormal; Notable for the following components:      Result Value   Glucose, Bld 101 (*)    Creatinine, Ser 1.67 (*)    Calcium 10.5 (*)    GFR calc non Af Amer 28 (*)    GFR calc Af Amer 32 (*)    All other components within normal limits  CBC WITH DIFFERENTIAL/PLATELET  MAGNESIUM  CBG MONITORING, ED    EKG EKG Interpretation  Date/Time:  Monday February 04 2019 12:10:05 EDT Ventricular Rate:  62 PR Interval:    QRS Duration: 139 QT Interval:  461 QTC Calculation: 457 R Axis:   -72 Text Interpretation:  Atrial fibrillation demand pacing? Confirmed by Raeford Razor [phone]) on 02/04/2019 12:33:20  PM   Radiology No results found.  Procedures Procedures (including critical care time)  Medications Ordered in ED Medications  sodium chloride 0.9 % bolus 500 mL (500 mLs Intravenous Not Given 02/04/19 1650)  ondansetron (ZOFRAN-ODT) disintegrating tablet 4 mg (4 mg Oral Given 02/04/19 1558)     Initial Impression / Assessment and Plan / ED Course  I have reviewed the triage vital signs and the nursing notes.  Pertinent labs & imaging results that were available during my care of the patient were reviewed by me and considered in my medical decision making (see chart for details).  Clinical Course as of Feb 03 2101  Mon Feb 04, 2019  1350 Spoke with daughter Tora Duck who states this has been an ongoing problem x2 months.  She has noticed that when doing physical activity her mother " passes out and her eyes roll back in her head."  I informed the daughter of our plan to check labs and ambulate the patient and will call her back with an update since she is unable to come to the hospital herself.  Daughter reports if patient ends up being discharged she will arrange to have the patient come home and not go back to the facility [facility] health and rehab   [KA]    Clinical Course User Index [KA] Sherene Sires, PA-C   Patient was admitted on 01/22/2019 for syncope and collapse.  Reviewing the EMR patient had a cardiac work-up that was negative including echocardiogram.  Her pacemaker was interrogated and resulted as "normal."  Neurology was also consulted and patient underwent EEG which did not show any specific epileptiform activity.  It did show a low amplitude changes.  Patient was discharged to a skilled nursing facility.  She is afebrile, in no acute distress. Patient presented today after syncopal episode while having a bowel movement this morning and also associated emesis.  Her work-up today include CBC unremarkable, magnesium within normal limits, BMP with creatinine of 1.67. Looking  through EMR pt had Creatinine of 1.37 x 11  days ago. Given her decreased PO intake over the last several days this could be in setting of dehydration. Encourage pt to increase PO intake and have labs rechecked by pcp. Her pacemaker was interegated and nothing was seen. Patient was able to ambulate with walker and minimal assistance.  EKG shows A. fib with demand pacing, verified by attending Dr. Juleen China. She is tolerating PO intake, nausea improved after zofran. Pt reports feeling better.  Social work also spoke with patient's daughter regarding disposition.  The plan agreed upon is for patient to return to [facility].  Admissions confirmed that they will reach out to the medical staff to let them know of patient's outpatient neurology recommendation.The SNF MD will make a decision as to neurology request. Pt case discussed with Dr. Juleen China who agrees with my plan.   Patient is hemodynamically stable, in NAD, and able to ambulate in the ED. Evaluation does not show pathology that would require ongoing emergent intervention or inpatient treatment. I explained the diagnosis to the patient. Pain has been managed and has no complaints prior to discharge. Patient is comfortable with above plan and is stable for discharge at this time. All questions were answered prior to disposition. Strict return precautions for returning to the ED were discussed. Encouraged pcp follow up and neurology per daughter's request,    Final Clinical Impressions(s) / ED Diagnoses   Final diagnoses:  Syncope, unspecified syncope type    ED Discharge Orders    None       Kathyrn Lass 02/04/19 2102    Raeford Razor, MD 02/15/19 1204

## 2019-02-04 NOTE — Discharge Instructions (Addendum)
You have been seen today for syncope. Please read and follow all provided instructions. Return to the emergency room for worsening condition or new concerning symptoms.     Your creatinine today was elevated. Please increase fluid intake and have blood work rechecked by pcp within 2-5 days to ensure it is improving with increased fluids.  We have discussed with your daughter and social work that neurology will come evaluate you at [facility].  1. Medications:  Continue usual home medications Take medications as prescribed. Please review all of the medicines and only take them if you do not have an allergy to them.  2. Treatment: rest, drink plenty of fluids 3. Follow Up: Please follow up with your primary doctor in 2-5 days for discussion of your diagnoses and further evaluation after today's visit;   It is also a possibility that you have an allergic reaction to any of the medicines that you have been prescribed - Everybody reacts differently to medications and while MOST people have no trouble with most medicines, you may have a reaction such as nausea, vomiting, rash, swelling, shortness of breath. If this is the case, please stop taking the medicine immediately and contact your physician.  ?

## 2019-02-04 NOTE — ED Notes (Signed)
Daughter(Sheryl Lynd [phone] called for update-would like a call back.

## 2019-02-04 NOTE — ED Notes (Signed)
Pacemaker interrogation complete

## 2019-02-04 NOTE — Progress Notes (Signed)
CSW received a call from [place]  Pt has been accepted for return by [facility] Number for report is: [phone] Pt's unit/room/bed number will be: [id number] Accepting physician: SNF MD  Pt can arrive ASAP on 02/04/2019  CSW will updated RN and EDP.  Dorothe Pea. Younes Degeorge, LCSW, LCAS, CSI Clinical Social Worker Ph: [phone]

## 2019-02-04 NOTE — Progress Notes (Signed)
CSW spoke with EPD regarding consult. EDP notifies CSW that Pt is ready for D/C. Per EPD, pt is from [facility] and pt's doctor who is actively supporting Pt, states pt's daughter prefer that Pt return home rather than [facility]. Per EDP, Pt's daughter goes into detail and explains that family is dissatisfied with the care given at [facility] and that the pt's symptoms were not being treated there. Per EPD, pt's daughter states that she is wheelchair bound.   CSW called pt's daughter, Niasia Lennox, at Ph: [phone] & [phone].  Pt's daughter reported that [facility] is fine but its "the hospitals that aren't doing anything and that they're not listening". Pt's daughter would like more testing done and suggested a neurology work up. Pt's daughter stated to either send her home or to a rehab center. Pt's daughter goes into detail and states that her primary concern is her mother "passing out" along with some vomiting. Pt's daughter reports that mother went to ED in [facility] and the problem still persists. Per Pt's daughter, the problems has lasted 2 months and states that the problem happens after she walks around or  transfers and then sits down.   Pt's daughter states that she has relayed this information to EPD and is concerned that the pt will D/C without a viable solution.   Pts' daughter states that she is disabled and that she can not assist her mother because she is disabled and wheelchair bound and states  "I keep requesting neurological testing".  CSW informed pt's daughter that he would call EPD to find out more information. CSW used active listening while pt's daughter expressed concern about there being no solution found to Pt passing out.    CSW called and staffed with EDP who confirmed Pt's test results do not indicate inpatient stay with neurology consult are necessary at this time and thus pt is cleared for discharge. EDP will place on discharge paper work  recommendation for outpatient neurology appointment due to pt's daughter concerns.  CSW called French Ana in admissions at [facility] at ph: [phone]. CSW requested that French Ana in admissions please ensure medical staff are alerted to the recommendation of the EPD for outpatient neurology follow up that was due to Pt's daughters concerns.    Please reconsult if future social work needs arise.  CSW signing off, as social work intervention is no longer needed.  Dorothe Pea. Claudetta Sallie, LCSW, LCAS, CSI Clinical Social Worker Ph: [phone]

## 2019-02-04 NOTE — Progress Notes (Signed)
CSW called Pt's daughter to follow up about the status of her mother's care. CSW also informed Pt's daughter that CSW spoke with French Ana in admissions and confirmed French Ana will reach out to medial staff verbally and confirm that they are aware of Pt's outpatient neurology recommendation so that SNF MD can make a decision as to the neurology request.  CSW also informed pt's daughter that [facility] is ready to receive Pt back tonight and ED staff will facilitate transfer to facility. Pt's daughter was appreciative and thanked CSW and expressed relief at confirmation that French Ana in admissions will follow up post discharge. Pt's daughter was agreeable to Pt returning to [facility]   CSW updated EPD/RN.   CSW will continue to follow for D/C needs.  Dorothe Pea. Sutton Plake, LCSW, LCAS, CSI Clinical Social Worker Ph: [phone]

## 2019-03-10 ENCOUNTER — Other Ambulatory Visit: Payer: Self-pay | Admitting: Cardiovascular Disease

## 2019-11-29 DEATH — deceased
